# Patient Record
Sex: Male | Born: 1945 | Race: White | Hispanic: No | Marital: Married | Smoking: Never smoker
Health system: Southern US, Community
[De-identification: ages and names within clinical notes are randomized; demographics above are authoritative.]

## PROBLEM LIST (undated history)

## (undated) DIAGNOSIS — N529 Male erectile dysfunction, unspecified: Secondary | ICD-10-CM

## (undated) DIAGNOSIS — R7303 Prediabetes: Secondary | ICD-10-CM

## (undated) DIAGNOSIS — E119 Type 2 diabetes mellitus without complications: Secondary | ICD-10-CM

## (undated) DIAGNOSIS — I10 Essential (primary) hypertension: Secondary | ICD-10-CM

## (undated) DIAGNOSIS — G629 Polyneuropathy, unspecified: Secondary | ICD-10-CM

## (undated) DIAGNOSIS — B181 Chronic viral hepatitis B without delta-agent: Secondary | ICD-10-CM

## (undated) DIAGNOSIS — I480 Paroxysmal atrial fibrillation: Secondary | ICD-10-CM

## (undated) DIAGNOSIS — Z9289 Personal history of other medical treatment: Secondary | ICD-10-CM

## (undated) DIAGNOSIS — E785 Hyperlipidemia, unspecified: Secondary | ICD-10-CM

## (undated) DIAGNOSIS — K760 Fatty (change of) liver, not elsewhere classified: Secondary | ICD-10-CM

## (undated) DIAGNOSIS — C439 Malignant melanoma of skin, unspecified: Secondary | ICD-10-CM

## (undated) DIAGNOSIS — N183 Chronic kidney disease, stage 3 unspecified: Secondary | ICD-10-CM

## (undated) HISTORY — DX: Malignant melanoma of skin, unspecified: C43.9

## (undated) HISTORY — PX: LASIK: SHX215

## (undated) HISTORY — DX: Fatty (change of) liver, not elsewhere classified: K76.0

## (undated) HISTORY — DX: Hyperlipidemia, unspecified: E78.5

## (undated) HISTORY — DX: Polyneuropathy, unspecified: G62.9

## (undated) HISTORY — DX: Type 2 diabetes mellitus without complications: E11.9

## (undated) HISTORY — PX: FOOT SURGERY: SHX648

## (undated) HISTORY — DX: Personal history of other medical treatment: Z92.89

## (undated) HISTORY — PX: OTHER SURGICAL HISTORY: SHX169

## (undated) HISTORY — PX: HYDROCELE EXCISION: SHX482

## (undated) HISTORY — PX: MELANOMA EXCISION: SHX5266

## (undated) HISTORY — DX: Prediabetes: R73.03

## (undated) HISTORY — DX: Essential (primary) hypertension: I10

## (undated) HISTORY — DX: Male erectile dysfunction, unspecified: N52.9

## (undated) HISTORY — DX: Chronic viral hepatitis B without delta-agent: B18.1

---

## 1998-05-20 ENCOUNTER — Other Ambulatory Visit: Admission: RE | Admit: 1998-05-20 | Discharge: 1998-05-20 | Payer: Self-pay | Admitting: Family Medicine

## 2003-04-30 ENCOUNTER — Encounter: Payer: Self-pay | Admitting: General Surgery

## 2003-04-30 ENCOUNTER — Ambulatory Visit (HOSPITAL_COMMUNITY): Admission: RE | Admit: 2003-04-30 | Discharge: 2003-04-30 | Payer: Self-pay | Admitting: General Surgery

## 2003-05-04 ENCOUNTER — Encounter: Payer: Self-pay | Admitting: General Surgery

## 2003-05-04 ENCOUNTER — Ambulatory Visit (HOSPITAL_COMMUNITY): Admission: RE | Admit: 2003-05-04 | Discharge: 2003-05-04 | Payer: Self-pay | Admitting: General Surgery

## 2003-05-04 ENCOUNTER — Encounter (INDEPENDENT_AMBULATORY_CARE_PROVIDER_SITE_OTHER): Payer: Self-pay | Admitting: Specialist

## 2003-08-27 ENCOUNTER — Encounter (HOSPITAL_COMMUNITY): Payer: Self-pay | Admitting: Oncology

## 2003-08-27 ENCOUNTER — Ambulatory Visit (HOSPITAL_COMMUNITY): Admission: RE | Admit: 2003-08-27 | Discharge: 2003-08-27 | Payer: Self-pay | Admitting: Oncology

## 2003-09-06 ENCOUNTER — Ambulatory Visit (HOSPITAL_COMMUNITY): Admission: RE | Admit: 2003-09-06 | Discharge: 2003-09-06 | Payer: Self-pay | Admitting: Oncology

## 2004-01-19 ENCOUNTER — Encounter: Admission: RE | Admit: 2004-01-19 | Discharge: 2004-01-19 | Payer: Self-pay | Admitting: Family Medicine

## 2004-03-13 ENCOUNTER — Ambulatory Visit (HOSPITAL_COMMUNITY): Admission: RE | Admit: 2004-03-13 | Discharge: 2004-03-13 | Payer: Self-pay | Admitting: Oncology

## 2004-03-21 ENCOUNTER — Ambulatory Visit (HOSPITAL_COMMUNITY): Admission: RE | Admit: 2004-03-21 | Discharge: 2004-03-21 | Payer: Self-pay | Admitting: Oncology

## 2004-09-05 ENCOUNTER — Ambulatory Visit (HOSPITAL_COMMUNITY): Admission: RE | Admit: 2004-09-05 | Discharge: 2004-09-05 | Payer: Self-pay | Admitting: Oncology

## 2004-09-08 ENCOUNTER — Ambulatory Visit: Payer: Self-pay | Admitting: Oncology

## 2004-11-13 ENCOUNTER — Encounter: Admission: RE | Admit: 2004-11-13 | Discharge: 2005-02-11 | Payer: Self-pay | Admitting: Family Medicine

## 2005-01-04 ENCOUNTER — Ambulatory Visit: Payer: Self-pay | Admitting: Oncology

## 2005-03-22 ENCOUNTER — Encounter: Admission: RE | Admit: 2005-03-22 | Discharge: 2005-06-20 | Payer: Self-pay | Admitting: Family Medicine

## 2005-05-10 ENCOUNTER — Ambulatory Visit: Payer: Self-pay | Admitting: Oncology

## 2005-05-12 ENCOUNTER — Emergency Department (HOSPITAL_COMMUNITY): Admission: EM | Admit: 2005-05-12 | Discharge: 2005-05-12 | Payer: Self-pay | Admitting: Emergency Medicine

## 2005-10-31 ENCOUNTER — Ambulatory Visit: Payer: Self-pay | Admitting: Oncology

## 2005-11-01 ENCOUNTER — Ambulatory Visit (HOSPITAL_COMMUNITY): Admission: RE | Admit: 2005-11-01 | Discharge: 2005-11-01 | Payer: Self-pay | Admitting: Oncology

## 2006-04-29 ENCOUNTER — Ambulatory Visit: Payer: Self-pay | Admitting: Oncology

## 2006-04-30 ENCOUNTER — Encounter: Admission: RE | Admit: 2006-04-30 | Discharge: 2006-04-30 | Payer: Self-pay | Admitting: Family Medicine

## 2006-05-02 LAB — COMPREHENSIVE METABOLIC PANEL
ALT: 33 U/L (ref 0–40)
AST: 26 U/L (ref 0–37)
Albumin: 5.1 g/dL (ref 3.5–5.2)
Calcium: 9.8 mg/dL (ref 8.4–10.5)
Chloride: 102 mEq/L (ref 96–112)
Potassium: 4.6 mEq/L (ref 3.5–5.3)
Sodium: 138 mEq/L (ref 135–145)

## 2006-05-02 LAB — CBC WITH DIFFERENTIAL/PLATELET
BASO%: 0.3 % (ref 0.0–2.0)
Basophils Absolute: 0 10*3/uL (ref 0.0–0.1)
EOS%: 3.8 % (ref 0.0–7.0)
HGB: 14.8 g/dL (ref 13.0–17.1)
MCH: 32.7 pg (ref 28.0–33.4)
MCHC: 35.5 g/dL (ref 32.0–35.9)
RDW: 13.1 % (ref 11.2–14.6)
lymph#: 1.4 10*3/uL (ref 0.9–3.3)

## 2006-05-03 ENCOUNTER — Ambulatory Visit (HOSPITAL_COMMUNITY): Admission: RE | Admit: 2006-05-03 | Discharge: 2006-05-03 | Payer: Self-pay | Admitting: Oncology

## 2006-05-09 ENCOUNTER — Encounter: Admission: RE | Admit: 2006-05-09 | Discharge: 2006-05-09 | Payer: Self-pay | Admitting: Family Medicine

## 2006-07-06 ENCOUNTER — Emergency Department (HOSPITAL_COMMUNITY): Admission: EM | Admit: 2006-07-06 | Discharge: 2006-07-06 | Payer: Self-pay | Admitting: Emergency Medicine

## 2006-07-10 ENCOUNTER — Ambulatory Visit (HOSPITAL_BASED_OUTPATIENT_CLINIC_OR_DEPARTMENT_OTHER): Admission: RE | Admit: 2006-07-10 | Discharge: 2006-07-10 | Payer: Self-pay | Admitting: Orthopedic Surgery

## 2007-05-06 ENCOUNTER — Ambulatory Visit: Payer: Self-pay | Admitting: Oncology

## 2007-05-08 LAB — LACTATE DEHYDROGENASE: LDH: 139 U/L (ref 94–250)

## 2007-05-08 LAB — CBC WITH DIFFERENTIAL/PLATELET
Basophils Absolute: 0 10*3/uL (ref 0.0–0.1)
EOS%: 2.9 % (ref 0.0–7.0)
LYMPH%: 20.3 % (ref 14.0–48.0)
MCH: 32 pg (ref 28.0–33.4)
MCV: 91.3 fL (ref 81.6–98.0)
MONO%: 7.2 % (ref 0.0–13.0)
RBC: 4.75 10*6/uL (ref 4.20–5.71)
RDW: 14.2 % (ref 11.2–14.6)

## 2007-05-08 LAB — COMPREHENSIVE METABOLIC PANEL
ALT: 28 U/L (ref 0–53)
Alkaline Phosphatase: 99 U/L (ref 39–117)
CO2: 29 mEq/L (ref 19–32)
Potassium: 4.8 mEq/L (ref 3.5–5.3)
Sodium: 138 mEq/L (ref 135–145)
Total Bilirubin: 1 mg/dL (ref 0.3–1.2)
Total Protein: 6.4 g/dL (ref 6.0–8.3)

## 2007-07-29 ENCOUNTER — Ambulatory Visit (HOSPITAL_COMMUNITY): Admission: RE | Admit: 2007-07-29 | Discharge: 2007-07-29 | Payer: Self-pay | Admitting: Oncology

## 2007-11-20 ENCOUNTER — Ambulatory Visit: Payer: Self-pay | Admitting: Oncology

## 2007-11-20 LAB — CBC WITH DIFFERENTIAL/PLATELET
BASO%: 0.4 % (ref 0.0–2.0)
EOS%: 3.1 % (ref 0.0–7.0)
HCT: 40.3 % (ref 38.7–49.9)
LYMPH%: 30.3 % (ref 14.0–48.0)
MCH: 32.7 pg (ref 28.0–33.4)
MCHC: 35.7 g/dL (ref 32.0–35.9)
NEUT%: 59 % (ref 40.0–75.0)
Platelets: 138 10*3/uL — ABNORMAL LOW (ref 145–400)

## 2007-11-20 LAB — COMPREHENSIVE METABOLIC PANEL
ALT: 26 U/L (ref 0–53)
AST: 21 U/L (ref 0–37)
Creatinine, Ser: 1.46 mg/dL (ref 0.40–1.50)
Total Bilirubin: 1.1 mg/dL (ref 0.3–1.2)

## 2008-05-17 ENCOUNTER — Ambulatory Visit: Payer: Self-pay | Admitting: Oncology

## 2008-05-20 LAB — CBC WITH DIFFERENTIAL/PLATELET
BASO%: 0.4 % (ref 0.0–2.0)
Eosinophils Absolute: 0.2 10*3/uL (ref 0.0–0.5)
MCHC: 35.5 g/dL (ref 32.0–35.9)
MONO#: 0.4 10*3/uL (ref 0.1–0.9)
MONO%: 8 % (ref 0.0–13.0)
NEUT#: 2.8 10*3/uL (ref 1.5–6.5)
RBC: 4.59 10*6/uL (ref 4.20–5.71)
RDW: 12.9 % (ref 11.2–14.6)
WBC: 4.8 10*3/uL (ref 4.0–10.0)

## 2008-05-20 LAB — COMPREHENSIVE METABOLIC PANEL
ALT: 40 U/L (ref 0–53)
Albumin: 4.7 g/dL (ref 3.5–5.2)
Alkaline Phosphatase: 95 U/L (ref 39–117)
CO2: 26 mEq/L (ref 19–32)
Glucose, Bld: 119 mg/dL — ABNORMAL HIGH (ref 70–99)
Potassium: 5 mEq/L (ref 3.5–5.3)
Sodium: 143 mEq/L (ref 135–145)
Total Protein: 7 g/dL (ref 6.0–8.3)

## 2008-05-20 LAB — LACTATE DEHYDROGENASE: LDH: 149 U/L (ref 94–250)

## 2008-09-28 ENCOUNTER — Ambulatory Visit (HOSPITAL_COMMUNITY): Admission: RE | Admit: 2008-09-28 | Discharge: 2008-09-28 | Payer: Self-pay | Admitting: Oncology

## 2009-05-17 ENCOUNTER — Ambulatory Visit: Payer: Self-pay | Admitting: Oncology

## 2010-05-19 ENCOUNTER — Ambulatory Visit: Payer: Self-pay | Admitting: Oncology

## 2010-05-23 ENCOUNTER — Ambulatory Visit (HOSPITAL_COMMUNITY): Admission: RE | Admit: 2010-05-23 | Discharge: 2010-05-23 | Payer: Self-pay | Admitting: Oncology

## 2010-05-23 LAB — CBC WITH DIFFERENTIAL/PLATELET
BASO%: 0.3 % (ref 0.0–2.0)
EOS%: 2 % (ref 0.0–7.0)
HCT: 38 % — ABNORMAL LOW (ref 38.4–49.9)
LYMPH%: 19.2 % (ref 14.0–49.0)
MCH: 31.7 pg (ref 27.2–33.4)
MCHC: 35.1 g/dL (ref 32.0–36.0)
MONO#: 0.4 10*3/uL (ref 0.1–0.9)
NEUT%: 73.3 % (ref 39.0–75.0)
Platelets: 157 10*3/uL (ref 140–400)

## 2010-05-23 LAB — COMPREHENSIVE METABOLIC PANEL
ALT: 17 U/L (ref 0–53)
CO2: 22 mEq/L (ref 19–32)
Creatinine, Ser: 1.25 mg/dL (ref 0.40–1.50)
Total Bilirubin: 0.7 mg/dL (ref 0.3–1.2)

## 2010-05-23 LAB — LACTATE DEHYDROGENASE: LDH: 135 U/L (ref 94–250)

## 2010-11-26 ENCOUNTER — Encounter (HOSPITAL_COMMUNITY): Payer: Self-pay | Admitting: Oncology

## 2011-03-23 NOTE — Op Note (Signed)
NAMEEMMONS, TOTH               ACCOUNT NO.:  [id number]   MEDICAL RECORD NO.:  [id number]          PATIENT TYPE:  AMB   LOCATION:  DSC                          FACILITY:  [facility]   PHYSICIAN:  Mila Homer. Sherlean Foot, M.D. DATE OF BIRTH:  06-07-1946   DATE OF PROCEDURE:  07/10/2006  DATE OF DISCHARGE:                                 OPERATIVE REPORT   SURGEON:  Mila Homer. Sherlean Foot, M.D.   ASSISTANT:  None.   ANESTHESIA:  General.   PREOPERATIVE DIAGNOSIS:  Right ankle bimalleolar ankle fracture.   POSTOPERATIVE DIAGNOSIS:  Same.   PROCEDURE:  Right ankle open reduction and internal fixation.   INDICATIONS FOR PROCEDURE:  The patient is a 65 year old white male with an  accident over the weekend that took him to the emergency room and he was  referred to me.  Informed consent was obtained for ORIF of the right ankle.   DESCRIPTION OF PROCEDURE:  The patient was placed supine and administered  general anesthesia.  The right leg was prepped and draped in the usual  sterile fashion.  A exsanguinated the extremity with the esmarch and then  inflated the tourniquet to 300 mm of Hz.  I then marked out a lateral  incision over the fracture site and it was approximately 6 cm in length. I  sharply incised the skin, bluntly incised the soft tissues to be careful of  the superficial peroneal nerve.  Once I got down to bone I cleaned off the  periosteum with periosteal elevator.  I then lavaged out the fracture site  and then used reduction clamps to reduce the fracture anatomically.  I then  placed a 6-hole semi-tubular plate and fastened it to the lateral cortex  with the fracture reduced using bicortical screws.  I then verified under AP  and lateral C-arm imaging that this was anatomic.  I then irrigated and  closed with 0 anterior Vicryl sutures and small skin staples.  I then turned  my attention to the medial side of the ankle.  I used a percutaneous  technique with the 4.5 mm cannulated  screw set.  I placed the guide wire in  under direct visualization with the AP and lateral C-arm images.  I then  over drilled with the 3.2 drill bit and then placed two partially threaded  45 mm screws to reduce the fracture anatomically.  The mortis was absolutely  anatomic.  I checked it on AP and lateral C-arm images.  I removed the pins  and then closed with two staples in each poke hole.  I then dressed it with  adaptive 4 x 4x, Webril and then a posterior splint.  Tourniquet time was 30  minutes.  EBL minimal.           ______________________________  Mila Homer. Sherlean Foot, M.D.     SDL/MEDQ  D:  07/10/2006  T:  07/10/2006  Job:  [id number]

## 2011-03-23 NOTE — Op Note (Signed)
Andrew Church, TUPPER NO.:  [id number]   MEDICAL RECORD NO.:  [id number]                   PATIENT TYPE:  OIB   LOCATION:  NA                                   FACILITY:  [facility]   PHYSICIAN:  Gita Kudo, M.D.              DATE OF BIRTH:  [birth year]   DATE OF PROCEDURE:  05/04/2003  DATE OF DISCHARGE:                                 OPERATIVE REPORT   OPERATIVE PROCEDURE:  Wide excision melanoma right groin with en bloc  resection sentinel lymph node.   SURGEON:  Gita Kudo, M.D.   ANESTHESIA:  General.   PREOPERATIVE DIAGNOSIS:  Malignant melanoma right groin.   POSTOPERATIVE DIAGNOSIS:  Malignant melanoma right groin.   CLINICAL SUMMARY:  65 year old male with a lesion in his right groin of  unknown duration had punch biopsy showing a level 4, 1.23 mm thick melanoma.  He comes in for excision.  He has had a preop scintigram showing activity in  the right groin.   OPERATIVE FINDINGS:  The patient had a hot blue node in the right groin.  After it was removed, the groin had just background, light radioactive  counts.  The lesion was removed with a 2 to 3 cm margin in all directions as  measured up.   OPERATIVE PROCEDURE:  Under satisfactory general anesthesia, the patient was  placed in a frog leg position and prepped and draped in a standard fashion.  Neoprobe was used to scan the groin and a hot area was noted about 2 inches  above the superior border of the melanoma.  It was marked.  Then, a ruler  was used to measure out 2.5 cm in all directions and an elliptical incision  made encompassing this wide margin of normal skin around the melanoma.  The  groin tissue was fairly lax and I carried the dissection down through the  fat to the superficial fascia.  The saphenous nerve was identified and not  injured.  Because the blue node was so close to the lesion, itself, I  removed it en bloc with the remaining skin and subcu from the  melanoma  biopsy.  When completed, the area was marked with sutures and sent to  pathology.  I discussed the situation with the pathologist so they would  know what we were searching for.  The wound was then lavaged with saline and  closed in layers with deep 2-0 Vicryl suture and staples for the skin.  Sterile absorbant dressings were then applied and the patient went to the  recovery room from the operating room in good condition without  complications.  Gita Kudo, M.D.    MRL/MEDQ  D:  05/04/2003  T:  05/04/2003  Job:  [id number]   cc:   Bethann Berkshire, M.D.

## 2011-06-07 ENCOUNTER — Other Ambulatory Visit (HOSPITAL_COMMUNITY): Payer: Self-pay | Admitting: Oncology

## 2011-06-07 ENCOUNTER — Ambulatory Visit (HOSPITAL_COMMUNITY)
Admission: RE | Admit: 2011-06-07 | Discharge: 2011-06-07 | Disposition: A | Discharge status: Home / Self Care | Payer: BC Managed Care – PPO | Source: Ambulatory Visit | Attending: Oncology | Admitting: Oncology

## 2011-06-07 ENCOUNTER — Encounter (HOSPITAL_BASED_OUTPATIENT_CLINIC_OR_DEPARTMENT_OTHER): Payer: BC Managed Care – PPO | Admitting: Oncology

## 2011-06-07 DIAGNOSIS — Z79899 Other long term (current) drug therapy: Secondary | ICD-10-CM | POA: Insufficient documentation

## 2011-06-07 DIAGNOSIS — C4359 Malignant melanoma of other part of trunk: Secondary | ICD-10-CM

## 2011-06-07 DIAGNOSIS — E119 Type 2 diabetes mellitus without complications: Secondary | ICD-10-CM | POA: Insufficient documentation

## 2011-06-07 DIAGNOSIS — C439 Malignant melanoma of skin, unspecified: Secondary | ICD-10-CM

## 2011-06-07 DIAGNOSIS — I10 Essential (primary) hypertension: Secondary | ICD-10-CM | POA: Insufficient documentation

## 2011-06-07 DIAGNOSIS — Z85820 Personal history of malignant melanoma of skin: Secondary | ICD-10-CM | POA: Insufficient documentation

## 2011-06-07 LAB — CBC WITH DIFFERENTIAL/PLATELET
Basophils Absolute: 0 10*3/uL (ref 0.0–0.1)
EOS%: 2.4 % (ref 0.0–7.0)
Eosinophils Absolute: 0.1 10*3/uL (ref 0.0–0.5)
HCT: 36.1 % — ABNORMAL LOW (ref 38.4–49.9)
HGB: 12.6 g/dL — ABNORMAL LOW (ref 13.0–17.1)
MCH: 31.5 pg (ref 27.2–33.4)
MONO#: 0.4 10*3/uL (ref 0.1–0.9)
NEUT#: 4 10*3/uL (ref 1.5–6.5)
NEUT%: 70.2 % (ref 39.0–75.0)
lymph#: 1.1 10*3/uL (ref 0.9–3.3)

## 2011-06-07 LAB — COMPREHENSIVE METABOLIC PANEL
BUN: 20 mg/dL (ref 6–23)
CO2: 27 mEq/L (ref 19–32)
Calcium: 10.3 mg/dL (ref 8.4–10.5)
Chloride: 104 mEq/L (ref 96–112)
Creatinine, Ser: 1.2 mg/dL (ref 0.50–1.35)
Glucose, Bld: 107 mg/dL — ABNORMAL HIGH (ref 70–99)

## 2011-06-07 LAB — LACTATE DEHYDROGENASE: LDH: 164 U/L (ref 94–250)

## 2011-06-11 ENCOUNTER — Other Ambulatory Visit (HOSPITAL_COMMUNITY): Payer: Self-pay | Admitting: Oncology

## 2011-06-11 LAB — FECAL OCCULT BLOOD, GUAIAC: Occult Blood: NEGATIVE

## 2011-12-14 ENCOUNTER — Telehealth: Payer: Self-pay | Admitting: Oncology

## 2011-12-14 NOTE — Telephone Encounter (Signed)
gve the pt her aug 2013 appts °

## 2011-12-14 NOTE — Telephone Encounter (Signed)
S/w the pt and he is aware of his aug 2013 appts

## 2012-03-28 ENCOUNTER — Telehealth: Payer: Self-pay | Admitting: Oncology

## 2012-03-28 NOTE — Telephone Encounter (Signed)
s/w pts wife and she will give him the appt for 8/1 at 9:00

## 2012-05-27 ENCOUNTER — Telehealth: Payer: Self-pay | Admitting: Oncology

## 2012-05-27 NOTE — Telephone Encounter (Signed)
Moved 8/1 appt from [facility] to [facility]. lmonvm for pt on both home/cell re new time for 8/1 @ 1 pm.

## 2012-06-05 ENCOUNTER — Ambulatory Visit (HOSPITAL_BASED_OUTPATIENT_CLINIC_OR_DEPARTMENT_OTHER): Payer: BC Managed Care – PPO | Admitting: Family

## 2012-06-05 ENCOUNTER — Ambulatory Visit (HOSPITAL_COMMUNITY)
Admission: RE | Admit: 2012-06-05 | Discharge: 2012-06-05 | Disposition: A | Discharge status: Home / Self Care | Payer: BC Managed Care – PPO | Source: Ambulatory Visit | Attending: Family | Admitting: Family

## 2012-06-05 ENCOUNTER — Other Ambulatory Visit (HOSPITAL_BASED_OUTPATIENT_CLINIC_OR_DEPARTMENT_OTHER): Payer: BC Managed Care – PPO

## 2012-06-05 ENCOUNTER — Ambulatory Visit: Payer: BC Managed Care – PPO | Admitting: Physician Assistant

## 2012-06-05 ENCOUNTER — Encounter: Payer: BC Managed Care – PPO | Admitting: Physician Assistant

## 2012-06-05 ENCOUNTER — Encounter: Payer: Self-pay | Admitting: Family

## 2012-06-05 ENCOUNTER — Other Ambulatory Visit: Payer: BC Managed Care – PPO | Admitting: Lab

## 2012-06-05 VITALS — BP 139/83 | HR 66 | Temp 97.3°F | Ht 71.0 in | Wt 181.5 lb

## 2012-06-05 DIAGNOSIS — K5900 Constipation, unspecified: Secondary | ICD-10-CM

## 2012-06-05 DIAGNOSIS — C439 Malignant melanoma of skin, unspecified: Secondary | ICD-10-CM

## 2012-06-05 DIAGNOSIS — Z85820 Personal history of malignant melanoma of skin: Secondary | ICD-10-CM | POA: Insufficient documentation

## 2012-06-05 DIAGNOSIS — Z09 Encounter for follow-up examination after completed treatment for conditions other than malignant neoplasm: Secondary | ICD-10-CM | POA: Insufficient documentation

## 2012-06-05 DIAGNOSIS — C4359 Malignant melanoma of other part of trunk: Secondary | ICD-10-CM

## 2012-06-05 DIAGNOSIS — I10 Essential (primary) hypertension: Secondary | ICD-10-CM | POA: Insufficient documentation

## 2012-06-05 DIAGNOSIS — G589 Mononeuropathy, unspecified: Secondary | ICD-10-CM

## 2012-06-05 LAB — COMPREHENSIVE METABOLIC PANEL
BUN: 24 mg/dL — ABNORMAL HIGH (ref 6–23)
CO2: 25 mEq/L (ref 19–32)
Calcium: 9.6 mg/dL (ref 8.4–10.5)
Chloride: 106 mEq/L (ref 96–112)
Creatinine, Ser: 1.3 mg/dL (ref 0.50–1.35)
Glucose, Bld: 90 mg/dL (ref 70–99)

## 2012-06-05 LAB — CBC WITH DIFFERENTIAL/PLATELET
Basophils Absolute: 0 10*3/uL (ref 0.0–0.1)
EOS%: 2.9 % (ref 0.0–7.0)
HCT: 39.9 % (ref 38.4–49.9)
HGB: 13.6 g/dL (ref 13.0–17.1)
MCH: 31.4 pg (ref 27.2–33.4)
MCV: 92.1 fL (ref 79.3–98.0)
MONO%: 6.9 % (ref 0.0–14.0)
NEUT%: 64.7 % (ref 39.0–75.0)
lymph#: 1.2 10*3/uL (ref 0.9–3.3)

## 2012-06-05 LAB — LACTATE DEHYDROGENASE: LDH: 125 U/L (ref 94–250)

## 2012-06-05 NOTE — Patient Instructions (Addendum)
Patient ID: Andrew Church,   DOB: 01/25/1946,  MRN: [id number]   [facility] Discharge Instructions  RECOMMENDATIONS MAD BY THE CONSULTANT AND ANY TEST RESULT(S) WILL BE FORWARDED TO YOU REFERRING DOCTOR   EXAM FINDINGS BY NURSE PRACTITIONER TODAY TO REPORT TO THE CLINIC OR PRIMARY PROVIDER: N/A   Your Current Medications Are: No current outpatient prescriptions on file.     INSTRUCTIONS GIVEN, DISCUSSED AND FOLLOW-UP: Stool softener, prunes, Miralax, increase water intake and increase fiber intake for constipation.  I acknowledge that I have been informed and understand all the instructions given to me and have received a copy.  I do not have any further questions at this time, but I understand that I may call the [facility] at [phone] during business hours should I have any further questions or need assistance in obtaining follow-up care.   06/05/2012, 2:11 PM

## 2012-06-05 NOTE — Progress Notes (Addendum)
Patient ID: Andrew Church, male   DOB: 1945/11/21, 66 y.o.   MRN: [id number] CSN: [id number]  CC: Andrew Church. Andrew Gunning, MD  Problem List: Andrew Church is a 66 y.o. Caucasian male with a problem list consisting of: 1. 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 Andrew Church was last seen by [place] on 06/07/2011.  Andrew Church has had sun exposure in the past and had shown Dr. Arline Asp a couple of lesions on the left side of his face and the other on his right pinna. Dr. Arline Asp suggested at that time that the patient be seen by a Dermatologist.  2. Neuropathy 3. Prediabetes 4. Hyperlipidemia 5. Hypertension  Past Medical History: Past Medical History  Diagnosis Date  . Neuropathy   . Prediabetes   . HTN (hypertension)   . Hyperlipemia   . Melanoma     Surgical History: Past Surgical History  Procedure Date  . Foot surgery     Current Medications: Current Outpatient Prescriptions  Medication Sig Dispense Refill  . aspirin 81 MG tablet Take 81 mg by mouth daily.      . Biotin 1000 MCG tablet Take 1,000 mcg by mouth daily.      . Cinnamon 500 MG capsule Take 500 mg by mouth daily.      Marland Kitchen gabapentin (NEURONTIN) 800 MG tablet Take 800 mg by mouth 3 (three) times daily.      Marland Kitchen gemfibrozil (LOPID) 600 MG tablet Take 600 mg by mouth 2 (two) times daily.      . Ginkgo Biloba 40 MG TABS Take 120 mg by mouth daily.      . Glucosamine-Chondroit-Vit C-Mn (GLUCOSAMINE CHONDR 1500 COMPLX) CAPS Take 1 capsule by mouth 2 (two) times daily.      Marland Kitchen L-Methylfolate-B6-B12 (METANX PO) Take 180 mg by mouth 2 (two) times daily.      Marland Kitchen lisinopril (PRINIVIL,ZESTRIL) 20 MG tablet Take 20 mg by mouth daily.      . metFORMIN (GLUCOPHAGE) 500 MG tablet Take 500 mg by mouth 2 (two) times daily with a meal.      . POTASSIUM GLUCONATE PO Take 1,200 mg by mouth 2 (two) times daily.      . Red Yeast Rice 600 MG CAPS Take 600 mg by mouth  daily.      Marland Kitchen topiramate (TOPAMAX) 25 MG tablet Take 25 mg by mouth daily.      . traMADol (ULTRAM) 50 MG tablet Take 50-100 mg by mouth every 8 (eight) hours as needed.        Allergies: Allergies  Allergen Reactions  . Penicillins Rash     Family History: Family History  Problem Relation Age of Onset  . Heart attack Mother   . Emphysema Father     Social History: History  Substance Use Topics  . Smoking status: Never Smoker   . Smokeless tobacco: Not on file  . Alcohol Use: No    Review of Systems: 10 Point review of systems was negative except as noted above.   Physical Exam:   Blood pressure 139/83, pulse 66, temperature 97.3 F (36.3 C), temperature source Oral, height 5\' 11"  (1.803 m), weight 181 lb 8 oz (82.328 kg).  General Appearance: Alert, cooperative, well nourished, well developed, no apparent distress Head: Normocephalic, without obvious abnormality, atraumatic Eyes: Conjunctivae/corneas clear, PERRLA, EOMI, anti-icteric sclera Nose: Nares, septum and mucosa are normal, no drainage or sinus tenderness Neck: No  adenopathy, supple, symmetrical, trachea midline, thyroid not enlarged, no tenderness Resp: Clear to auscultation bilaterally Cardio: Regular rate and rhythm, S1, S2 normal, no murmur/click/rub/gallop GI: Soft, non-tender; bowel sounds normal; no masses,  no organomegaly GU: Deferred, the patient denies any inguinal masses or tenderness Extremities: Extremities normal, atraumatic, no cyanosis or edema Lymph nodes: Cervical, supraclavicular, and axillary nodes normal.   Laboratory Data: Results for orders placed in visit on 06/07/11 (from the past 48 hour(s))  CBC WITH DIFFERENTIAL     Status: Abnormal   Collection Time   06/05/12  1:09 PM      Component Value Range Comment   WBC 4.8  4.0 - 10.3 10e3/uL    NEUT# 3.1  1.5 - 6.5 10e3/uL    HGB 13.6  13.0 - 17.1 g/dL    HCT 16.1  09.6 - 04.5 %    Platelets 120 (*) 140 - 400 10e3/uL    MCV 92.1   79.3 - 98.0 fL    MCH 31.4  27.2 - 33.4 pg    MCHC 34.1  32.0 - 36.0 g/dL    RBC 4.09  8.11 - 9.14 10e6/uL    RDW 14.0  11.0 - 14.6 %    lymph# 1.2  0.9 - 3.3 10e3/uL    MONO# 0.3  0.1 - 0.9 10e3/uL    Eosinophils Absolute 0.1  0.0 - 0.5 10e3/uL    Basophils Absolute 0.0  0.0 - 0.1 10e3/uL    NEUT% 64.7  39.0 - 75.0 %    LYMPH% 25.0  14.0 - 49.0 %    MONO% 6.9  0.0 - 14.0 %    EOS% 2.9  0.0 - 7.0 %    BASO% 0.5  0.0 - 2.0 %     Imaging Studies: Dg Chest 2 View  06/05/2012  *RADIOLOGY REPORT*  Clinical Data: Annual follow up, hypertension, history of melanoma  CHEST - 2 VIEW  Comparison: 06/07/2011  Findings: Normal heart size and vascularity.  Negative for acute airspace process, edema, pneumonia, collapse, consolidation, effusion, or pneumothorax.  Trachea midline.  Degenerative changes of the spine.  IMPRESSION: Stable chest exam.  No acute process  Original Report Authenticated By: Judie Petit. Andrew Church, M.D.     Impression/Plan: Andrew Church is now 66 years out from his diagnosis. The patient is complaining of constipation today. The patient has been instructed to increase his fluid intake, consume prunes/Miralax, increase his fiber intake, and take daily stool softener. The patient states that he he has had a colonoscopy this year, he receives the influenza vaccine yearly and he state he had the pneumonia vaccine 2 months ago.  The patient did not complaining of any memory decline or melena during this office visit. The patient was without complaints or symptamatology other than constipation during this office visit. The patient will return in one year for a followup appointment and laboratories, but is asked to return to the office immediately if he notices any changes.  Plan of care reviewed with Dr. Arline Asp.   Andrew Hasler NP-C 06/05/2012, 2:46 PM

## 2013-06-08 ENCOUNTER — Other Ambulatory Visit: Payer: Self-pay | Admitting: Medical Oncology

## 2013-06-08 DIAGNOSIS — C439 Malignant melanoma of skin, unspecified: Secondary | ICD-10-CM

## 2013-06-09 ENCOUNTER — Other Ambulatory Visit: Payer: BC Managed Care – PPO | Admitting: Lab

## 2013-06-09 ENCOUNTER — Ambulatory Visit: Payer: BC Managed Care – PPO

## 2013-06-09 ENCOUNTER — Telehealth: Payer: Self-pay | Admitting: Hematology and Oncology

## 2013-06-09 ENCOUNTER — Ambulatory Visit: Payer: BC Managed Care – PPO | Admitting: Physician Assistant

## 2013-06-09 NOTE — Telephone Encounter (Signed)
per Sallye Ober and Dr. Karel Jarvis they wanted to put this pt on [facility] sched Done

## 2013-09-03 ENCOUNTER — Emergency Department (HOSPITAL_COMMUNITY): Payer: BC Managed Care – PPO

## 2013-09-03 ENCOUNTER — Emergency Department (HOSPITAL_COMMUNITY)
Admission: EM | Admit: 2013-09-03 | Discharge: 2013-09-03 | Disposition: A | Discharge status: Home / Self Care | Payer: BC Managed Care – PPO | Attending: Emergency Medicine | Admitting: Emergency Medicine

## 2013-09-03 ENCOUNTER — Encounter (HOSPITAL_COMMUNITY): Payer: Self-pay | Admitting: Emergency Medicine

## 2013-09-03 DIAGNOSIS — I10 Essential (primary) hypertension: Secondary | ICD-10-CM | POA: Insufficient documentation

## 2013-09-03 DIAGNOSIS — G589 Mononeuropathy, unspecified: Secondary | ICD-10-CM | POA: Insufficient documentation

## 2013-09-03 DIAGNOSIS — Z8639 Personal history of other endocrine, nutritional and metabolic disease: Secondary | ICD-10-CM | POA: Insufficient documentation

## 2013-09-03 DIAGNOSIS — Z85820 Personal history of malignant melanoma of skin: Secondary | ICD-10-CM | POA: Insufficient documentation

## 2013-09-03 DIAGNOSIS — R079 Chest pain, unspecified: Secondary | ICD-10-CM

## 2013-09-03 DIAGNOSIS — Z862 Personal history of diseases of the blood and blood-forming organs and certain disorders involving the immune mechanism: Secondary | ICD-10-CM | POA: Insufficient documentation

## 2013-09-03 DIAGNOSIS — Z7982 Long term (current) use of aspirin: Secondary | ICD-10-CM | POA: Insufficient documentation

## 2013-09-03 DIAGNOSIS — R0789 Other chest pain: Secondary | ICD-10-CM | POA: Insufficient documentation

## 2013-09-03 DIAGNOSIS — Z880 Allergy status to penicillin: Secondary | ICD-10-CM | POA: Insufficient documentation

## 2013-09-03 DIAGNOSIS — Z79899 Other long term (current) drug therapy: Secondary | ICD-10-CM | POA: Insufficient documentation

## 2013-09-03 LAB — COMPREHENSIVE METABOLIC PANEL
ALT: 33 U/L (ref 0–53)
AST: 27 U/L (ref 0–37)
CO2: 28 mEq/L (ref 19–32)
Calcium: 9.7 mg/dL (ref 8.4–10.5)
Creatinine, Ser: 1.27 mg/dL (ref 0.50–1.35)
GFR calc Af Amer: 65 mL/min — ABNORMAL LOW (ref >90)
GFR calc non Af Amer: 56 mL/min — ABNORMAL LOW (ref >90)
Sodium: 140 mEq/L (ref 135–145)
Total Protein: 7 g/dL (ref 6.0–8.3)

## 2013-09-03 LAB — CBC
MCH: 31.9 pg (ref 26.0–34.0)
MCHC: 35.8 g/dL (ref 30.0–36.0)
MCV: 89.2 fL (ref 78.0–100.0)
Platelets: 125 10*3/uL — ABNORMAL LOW (ref 150–400)
RDW: 13.4 % (ref 11.5–15.5)

## 2013-09-03 LAB — APTT: aPTT: 31 seconds (ref 24–37)

## 2013-09-03 LAB — POCT I-STAT TROPONIN I: Troponin i, poc: 0 ng/mL (ref 0.00–0.08)

## 2013-09-03 LAB — PROTIME-INR: INR: 1.04 (ref 0.00–1.49)

## 2013-09-03 MED ORDER — SODIUM CHLORIDE 0.9 % IV SOLN
1000.0000 mL | INTRAVENOUS | Status: DC
  Administered 2013-09-03: 1000 mL via INTRAVENOUS

## 2013-09-03 MED ORDER — ASPIRIN 81 MG PO CHEW: 324.0000 mg | CHEWABLE_TABLET | Freq: Once | ORAL | Status: DC

## 2013-09-03 NOTE — ED Provider Notes (Signed)
.   Medical screening examination/treatment/procedure(s) were performed by non-physician practitioner and as supervising physician I was immediately available for consultation/collaboration.  EKG Interpretation     Ventricular Rate:  63 PR Interval:  173 QRS Duration: 98 QT Interval:  405 QTC Calculation: 415 R Axis:   70 Text Interpretation:  Sinus rhythm Abnormal R-wave progression, early transition No significant change since last tracing             Harrold Donath R. Rubin Payor, MD 09/03/13 2326

## 2013-09-03 NOTE — ED Notes (Signed)
Pt to ed via ems for eval of cp.  Had been shopping and to the gym and walked up to EMS base with c/o intermittent CP.  Pain seems to be linked to PVCs, which were every third beat.  Initial BP 200/100. 324 asa and 1 sl ntg.  PVCs less frequent.  No c/o cp at this time.

## 2013-09-03 NOTE — ED Provider Notes (Signed)
Full history, ROS, physical examination may be reviewed from Dr. Shela Commons Knapp's chart.  Physical Exam  BP 171/86  Pulse 61  Temp(Src) 98.6 F (37 C) (Oral)  Resp 12  Wt 182 lb (82.555 kg)  BMI 25.4 kg/m2  SpO2 98%  Physical Exam  ED Course  Procedures      POCT i-Stat troponin I (Final result)   Component (Lab Inquiry)      Collection Time Result Time Troponin i, poc Comment 3    09/03/13 22:23:00 09/03/13 22:36:00 0.00        Due to the release kinetics of cTnI, a negative result within the first hours of the onset of symptoms does not rule out myocardial infarction with certainty. If myocardial infarction is still suspected, repeat the test at appropriate intervals.                   POCT i-Stat troponin I (Final result)   Component (Lab Inquiry)      Collection Time Result Time Troponin i, poc Comment 3    09/03/13 19:22:00 09/03/13 19:31:12 0.00        Due to the release kinetics of cTnI, a negative result within the first hours of the onset of symptoms does not rule out myocardial infarction with certainty. If myocardial infarction is still suspected, repeat the test at appropriate intervals.                   CBC (Final result) Abnormal Component (Lab Inquiry)      Collection Time Result Time WBC RBC HGB HCT MCV    09/03/13 17:44:00 09/03/13 19:23:44 5.6 4.17 (L) 13.3 37.2 (L) 89.2         Collection Time Result Time MCH MCHC RDW PLT    09/03/13 17:44:00 09/03/13 19:23:44 31.9 35.8 13.4 125 (L)                   Comprehensive metabolic panel (Final result) Abnormal Component (Lab Inquiry)      Collection Time Result Time NA K CL CO2 GLUCOSE    09/03/13 17:44:00 09/03/13 19:55:10 140 4.5 104 28 97         Collection Time Result Time BUN Creatinine, Ser CALCIUM PROTEIN Albumin    09/03/13 17:44:00 09/03/13 19:55:10 21 1.27 9.7 7.0 4.2         Collection Time Result Time AST ALT ALK PHOS BILI TOTL GFR calc non Af Amer    09/03/13 17:44:00 09/03/13 19:55:10 27 33 99 0.7  56 (L)         Collection Time Result Time GFR calc Af Amer    09/03/13 17:44:00 09/03/13 19:55:10 65 (L) (NOTE) The eGFR has been calculated using the CKD EPI equation. This calculation has not been validated in all clinical situations. eGFR's persistently <90 mL/min signify possible Chronic Kidney Disease.                   Protime-INR (Final result)   Component (Lab Inquiry)      Collection Time Result Time Prothrombin Time INR    09/03/13 17:44:00 09/03/13 19:31:07 13.4 1.04                   APTT (Final result)   Component (Lab Inquiry)      Collection Time Result Time aPTT    09/03/13 17:44:00 09/03/13 19:31:07 31                Imaging Results  DG Chest Portable 1 View (Final result)   Result time: 09/03/13 18:50:47    Final result by Rad Results In Interface (09/03/13 18:50:47)    Narrative:    CLINICAL DATA:  Chest pain  EXAM: PORTABLE CHEST - 1 VIEW  COMPARISON:  06/05/2012  FINDINGS: Cardiomediastinal silhouette is stable. No acute infiltrate or pleural effusion. No pulmonary edema. Bony thorax is unremarkable.  IMPRESSION: No active disease.   Electronically Signed   By: Natasha Mead M.D.   On: 09/03/2013 18:50         MDM  Patient care obtained from Dr. Roselyn Bering disposition pending Hour 3 i-stat troponin with plan to d/c if negative. Full HPI and ROS available from his note. Patient has continued to remain CP free since shift change and the second troponin has returned negative. Will discharge patient home with return precautions and advised to f/u with PCP and cardiology regarding today's visit. Patient is agreeable to plan. Patient is stable at time of discharge. Discussed this plan of care with Dr. Lynelle Doctor.         Jeannetta Ellis, PA-C 09/03/13 2301

## 2013-09-03 NOTE — ED Notes (Signed)
Portable x-ray tech at bedside 

## 2013-09-03 NOTE — ED Provider Notes (Signed)
CSN: [id number]     Arrival date & time 09/03/13  1807 History   First MD Initiated Contact with Patient 09/03/13 1821     Chief Complaint  Patient presents with  . Chest Pain    HPI Patient started having intermittent episodes of chest pain today off and on since around lunchtime. The episodes would last a few minutes at a time. The pain was sharp in the center and left chest. It did not radiate. He did not have any jaw pain, nausea or shortness of breath. Patient had been at the gym today working out without any difficulty. Because of the persistent symptoms the patient decided to get his blood pressure checked. He went to an EMS station. They noted him to have PVCs and an elevated blood pressure of 200/120. The patient was given 324 mg of aspirin and a sublingual nitroglycerin. Patient denies any pain currently. He denies any fevers cough and shortness of breath. He does not have any known history of heart disease.  Past Medical History  Diagnosis Date  . Neuropathy   . Prediabetes   . HTN (hypertension)   . Hyperlipemia   . Melanoma    Past Surgical History  Procedure Laterality Date  . Foot surgery     Family History  Problem Relation Age of Onset  . Heart attack Mother   . Emphysema Father    History  Substance Use Topics  . Smoking status: Never Smoker   . Smokeless tobacco: Not on file  . Alcohol Use: No    Review of Systems  All other systems reviewed and are negative.    Allergies  Penicillins  Home Medications   Current Outpatient Rx  Name  Route  Sig  Dispense  Refill  . aspirin 81 MG tablet   Oral   Take 81 mg by mouth daily.         Marland Kitchen b complex vitamins tablet   Oral   Take 1 tablet by mouth daily.         . Biotin 1000 MCG tablet   Oral   Take 1,000 mcg by mouth daily.         . Cinnamon 500 MG capsule   Oral   Take 500 mg by mouth daily.         . Coenzyme Q10 (CO Q 10 PO)   Oral   Take 1 tablet by mouth daily.         Marland Kitchen  gabapentin (NEURONTIN) 800 MG tablet   Oral   Take 800 mg by mouth 3 (three) times daily.         Marland Kitchen gemfibrozil (LOPID) 600 MG tablet   Oral   Take 600 mg by mouth 2 (two) times daily.         . Ginkgo Biloba 40 MG TABS   Oral   Take 120 mg by mouth daily.         . Glucosamine-Chondroit-Vit C-Mn (GLUCOSAMINE CHONDR 1500 COMPLX) CAPS   Oral   Take 1 capsule by mouth 2 (two) times daily.         Marland Kitchen lisinopril (PRINIVIL,ZESTRIL) 20 MG tablet   Oral   Take 20 mg by mouth daily.         . milk thistle 175 MG tablet   Oral   Take 175 mg by mouth daily.         . Red Yeast Rice 600 MG CAPS   Oral   Take  600 mg by mouth daily.         . traMADol (ULTRAM) 50 MG tablet   Oral   Take 50-200 mg by mouth every 8 (eight) hours as needed for pain.          . vitamin B-12 (CYANOCOBALAMIN) 1000 MCG tablet   Oral   Take 1,000 mcg by mouth daily.          BP 166/78  Pulse 62  Temp(Src) 98.4 F (36.9 C) (Oral)  Resp 16  Wt 182 lb (82.555 kg)  BMI 25.4 kg/m2  SpO2 99% Physical Exam  Nursing note and vitals reviewed. Constitutional: He appears well-developed and well-nourished. No distress.  HENT:  Head: Normocephalic and atraumatic.  Right Ear: External ear normal.  Left Ear: External ear normal.  Eyes: Conjunctivae are normal. Right eye exhibits no discharge. Left eye exhibits no discharge. No scleral icterus.  Neck: Neck supple. No tracheal deviation present.  Cardiovascular: Normal rate, regular rhythm and intact distal pulses.   Pulmonary/Chest: Effort normal and breath sounds normal. No stridor. No respiratory distress. He has no wheezes. He has no rales.  Abdominal: Soft. Bowel sounds are normal. He exhibits no distension. There is no tenderness. There is no rebound and no guarding.  Musculoskeletal: He exhibits no edema and no tenderness.  Neurological: He is alert. He has normal strength. No sensory deficit. Cranial nerve deficit:  no gross defecits  noted. He exhibits normal muscle tone. He displays no seizure activity. Coordination normal.  Skin: Skin is warm and dry. No rash noted.  Psychiatric: He has a normal mood and affect.    ED Course  Procedures (including critical care time) Labs Review Labs Reviewed  CBC - Abnormal; Notable for the following:    RBC 4.17 (*)    HCT 37.2 (*)    Platelets 125 (*)    All other components within normal limits  COMPREHENSIVE METABOLIC PANEL - Abnormal; Notable for the following:    GFR calc non Af Amer 56 (*)    GFR calc Af Amer 65 (*)    All other components within normal limits  PROTIME-INR  APTT  POCT I-STAT TROPONIN I   Imaging Review Dg Chest Portable 1 View  09/03/2013   CLINICAL DATA:  Chest pain  EXAM: PORTABLE CHEST - 1 VIEW  COMPARISON:  06/05/2012  FINDINGS: Cardiomediastinal silhouette is stable. No acute infiltrate or pleural effusion. No pulmonary edema. Bony thorax is unremarkable.  IMPRESSION: No active disease.   Electronically Signed   By: Natasha Mead M.D.   On: 09/03/2013 18:50    EKG Interpretation     Ventricular Rate:  63 PR Interval:  173 QRS Duration: 98 QT Interval:  405 QTC Calculation: 415 R Axis:   70 Text Interpretation:  Sinus rhythm Abnormal R-wave progression, early transition No significant change since last tracing            MDM  Pt exercises regularly without chest pain.  The symptoms today were atypical.  Heart Score of 3. Initial troponin was normal.  Plan on second marker.  If normal will have patient follow up with PCP to discuss his blood pressure medications and possible stress testing as an outpatient.    Celene Kras, MD 09/03/13 2019

## 2013-09-17 ENCOUNTER — Ambulatory Visit (INDEPENDENT_AMBULATORY_CARE_PROVIDER_SITE_OTHER): Payer: BC Managed Care – PPO | Admitting: Cardiology

## 2013-09-17 ENCOUNTER — Encounter: Payer: Self-pay | Admitting: Cardiology

## 2013-09-17 VITALS — BP 170/90 | HR 69 | Ht 71.0 in | Wt 188.0 lb

## 2013-09-17 DIAGNOSIS — R079 Chest pain, unspecified: Secondary | ICD-10-CM | POA: Insufficient documentation

## 2013-09-17 DIAGNOSIS — I160 Hypertensive urgency: Secondary | ICD-10-CM

## 2013-09-17 DIAGNOSIS — I10 Essential (primary) hypertension: Secondary | ICD-10-CM

## 2013-09-17 MED ORDER — AMLODIPINE BESYLATE 5 MG PO TABS: 5.0000 mg | ORAL_TABLET | Freq: Every day | ORAL | Status: DC

## 2013-09-17 NOTE — Progress Notes (Signed)
Patient ID: VINCENZO STAVE, male   DOB: 01-28-1946, 67 y.o.   MRN: [id number]     Patient Name: Andrew Church Date of Encounter: 09/17/2013  Primary Care Provider:  No primary provider on file. Primary Cardiologist:  Tobias Alexander, H  Problem List   Past Medical History  Diagnosis Date  . Neuropathy   . Prediabetes   . HTN (hypertension)   . Hyperlipemia   . Melanoma    Past Surgical History  Procedure Laterality Date  . Foot surgery      Allergies  Allergies  Allergen Reactions  . Penicillins Rash    HPI  67 year old gentleman with h/o hypertension, HLP, IGT, who went to the ER for intermittent episodes of chest pain while being at rest at home. He reports that he was exrcising that day for an hour in the gym without any symptoms. He later went shopping to [facility] where he developed episodes of chest pain lasting a few minutes at a time. The pain was sharp in the center and left chest. It did not radiate. He did not have any jaw pain, nausea or shortness of breath.  The patient found paramedics that check his BP ant it was 200/110. In the ambulance he was told that he had very frequent PVCs. It was elevated in the ER as well.  He has been taking Lisinopril for the last 10 years. He denies palpitations or syncope.   Home Medications  Prior to Admission medications   Medication Sig Start Date End Date Taking? Authorizing Provider  aspirin 81 MG tablet Take 81 mg by mouth daily.   Yes Historical Provider, MD  b complex vitamins tablet Take 1 tablet by mouth daily.   Yes Historical Provider, MD  Betaine, Trimethylglycine, 500 MG CAPS Take 2 capsules by mouth daily.   Yes Historical Provider, MD  Biotin 1000 MCG tablet Take 1,000 mcg by mouth daily.   Yes Historical Provider, MD  Cinnamon 500 MG capsule Take 500 mg by mouth daily.   Yes Historical Provider, MD  Coenzyme Q10 (CO Q 10 PO) Take 1 tablet by mouth daily.   Yes Historical Provider, MD  gabapentin  (NEURONTIN) 800 MG tablet Take 800 mg by mouth 3 (three) times daily.   Yes Historical Provider, MD  gemfibrozil (LOPID) 600 MG tablet Take 600 mg by mouth 2 (two) times daily.   Yes Historical Provider, MD  Ginkgo Biloba 40 MG TABS Take 120 mg by mouth daily.   Yes Historical Provider, MD  Glucosamine-Chondroit-Vit C-Mn (GLUCOSAMINE CHONDR 1500 COMPLX) CAPS Take 1 capsule by mouth 2 (two) times daily.   Yes Historical Provider, MD  lisinopril (PRINIVIL,ZESTRIL) 20 MG tablet Take 20 mg by mouth daily.   Yes Historical Provider, MD  milk thistle 175 MG tablet Take 175 mg by mouth daily.   Yes Historical Provider, MD  NON FORMULARY Blood sugar extract 1 tab po qd   Yes Historical Provider, MD  Omega-3 Fatty Acids (FISH OIL PO) Take 1 tablet by mouth daily.   Yes Historical Provider, MD  Red Yeast Rice 600 MG CAPS Take 600 mg by mouth daily.   Yes Historical Provider, MD  traMADol (ULTRAM) 50 MG tablet Take 50-200 mg by mouth every 8 (eight) hours as needed for pain.    Yes Historical Provider, MD  vitamin B-12 (CYANOCOBALAMIN) 1000 MCG tablet Take 1,000 mcg by mouth daily.   Yes Historical Provider, MD    Family History  Family History  Problem Relation Age of Onset  . Heart attack Mother   . Emphysema Father     Social History  History   Social History  . Marital Status: Married    Spouse Name: N/A    Number of Children: N/A  . Years of Education: N/A   Occupational History  . Not on file.   Social History Main Topics  . Smoking status: Never Smoker   . Smokeless tobacco: Not on file  . Alcohol Use: No  . Drug Use: No  . Sexual Activity: No   Other Topics Concern  . Not on file   Social History Narrative  . No narrative on file     Review of Systems, as per HPI, otherwise negative General:  No chills, fever, night sweats or weight changes.  Cardiovascular:  No chest pain, dyspnea on exertion, edema, orthopnea, palpitations, paroxysmal nocturnal  dyspnea. Dermatological: No rash, lesions/masses Respiratory: No cough, dyspnea Urologic: No hematuria, dysuria Abdominal:   No nausea, vomiting, diarrhea, bright red blood per rectum, melena, or hematemesis Neurologic:  No visual changes, wkns, changes in mental status. All other systems reviewed and are otherwise negative except as noted above.  Physical Exam  Blood pressure 170/90, pulse 69, height 5\' 11"  (1.803 m), weight 188 lb (85.276 kg).  General: Pleasant, NAD Psych: Normal affect. Neuro: Alert and oriented X 3. Moves all extremities spontaneously. HEENT: Normal  Neck: Supple without bruits or JVD. Lungs:  Resp regular and unlabored, CTA. Heart: RRR no s3, s4, or murmurs. Abdomen: Soft, non-tender, non-distended, BS + x 4.  Extremities: No clubbing, cyanosis or edema. DP/PT/Radials 2+ and equal bilaterally.  Labs:  No results found for this basename: CKTOTAL, CKMB, TROPONINI,  in the last 72 hours Lab Results  Component Value Date   WBC 5.6 09/03/2013   HGB 13.3 09/03/2013   HCT 37.2* 09/03/2013   MCV 89.2 09/03/2013   PLT 125* 09/03/2013   No results found for this basename: NA, K, CL, CO2, BUN, CREATININE, CALCIUM, LABALBU, PROT, BILITOT, ALKPHOS, ALT, AST, GLUCOSE,  in the last 168 hours No results found for this basename: CHOL, HDL, LDLCALC, TRIG   No results found for this basename: DDIMER   No components found with this basename: POCBNP,   Accessory Clinical Findings  echocardiogram  ECG - NSR, 69 BPM, normal ECG   Assessment & Plan  67 year old male  1. Hypertensive urgency - we will start the patient on 5 mg of amlodipine daily. We will order an exercise treadmill stress test that will assess possible ischemia and BP response to exercise.  2. Chest pain - rather atypical - during episodes of hypertension, non-exertional  3. Lipids - none in records, we will order  4. Hepatitis B Ag positivity - per patient, we will avoid liver affecting drugs    Follow up after the stress test  Lars Masson, MD, [facility] 09/17/2013, 3:37 PM

## 2013-09-17 NOTE — Patient Instructions (Addendum)
Your physician has requested that you have an exercise tolerance test. Please also follow instruction sheet, as given.  Your physician recommends that you schedule a follow-up appointment in: WITH DR. Delton See AFTER TEST  Your physician has recommended you make the following change in your medication:   START AMLODIPINE 5 MG ONCE A DAY  Your physician recommends that you continue on your current medications as directed. Please refer to the Current Medication list given to you today.

## 2013-09-18 ENCOUNTER — Other Ambulatory Visit: Payer: Self-pay | Admitting: *Deleted

## 2013-09-18 DIAGNOSIS — R079 Chest pain, unspecified: Secondary | ICD-10-CM

## 2013-09-18 MED ORDER — AMLODIPINE BESYLATE 5 MG PO TABS: 5.0000 mg | ORAL_TABLET | Freq: Every day | ORAL | Status: DC

## 2013-10-27 ENCOUNTER — Encounter: Payer: Self-pay | Admitting: *Deleted

## 2013-11-02 ENCOUNTER — Ambulatory Visit (INDEPENDENT_AMBULATORY_CARE_PROVIDER_SITE_OTHER): Payer: BC Managed Care – PPO | Admitting: Physician Assistant

## 2013-11-02 DIAGNOSIS — R9439 Abnormal result of other cardiovascular function study: Secondary | ICD-10-CM

## 2013-11-02 DIAGNOSIS — R079 Chest pain, unspecified: Secondary | ICD-10-CM

## 2013-11-02 NOTE — Patient Instructions (Signed)
Your physician has requested that you have a lexiscan myoview. Please follow instruction sheet, as given.

## 2013-11-02 NOTE — Progress Notes (Signed)
Exercise Treadmill Test  Pre-Exercise Testing Evaluation Rhythm: normal sinus  Rate: 74     Test  Exercise Tolerance Test Ordering MD: Tobias Alexander  Interpreting MD: Tereso Newcomer, PA-C  Unique Test No: 1  Treadmill:  1  Indication for ETT: chest pain - rule out ischemia  Contraindication to ETT: No   Stress Modality: exercise - treadmill  Cardiac Imaging Performed: non   Protocol: standard Bruce - maximal  Max BP:  215/114  Max MPHR (bpm):  151 85% MPR (bpm):  128  MPHR obtained (bpm):  136 % MPHR obtained:  90  Reached 85% MPHR (min:sec):  6:15 Total Exercise Time (min-sec):  7:00  Workload in METS:  8.5 Borg Scale: 14  Reason ETT Terminated:  exaggerated hypertensive response    ST Segment Analysis At Rest: normal ST segments - no evidence of significant ST depression With Exercise: significant ischemic ST depression  Other Information Arrhythmia:  No Angina during ETT:  absent (0) Quality of ETT:  diagnostic  ETT Interpretation:  abnormal - evidence of ST depression consistent with ischemia  Comments: Fair exercise capacity. No chest pain. Baseline BP elevated at 170/95.  There was hypertensive BP response to exercise. There was significant ST depression suggestive of ischemia at peak exercise in the inferior and anterolateral leads.  Recommendations: The patient stopped his Lisinopril when he started Amlodipine.  I have asked him to restart the Lisinopril along with his Amlodipine.   He had significant ST changes with exercise but no symptoms of chest pain and he exercised 7 minutes.   Arrange LexiScan Myoview. Discussed with patient. Follow up with Dr. Tobias Alexander as directed. Signed,  Tereso Newcomer, PA-C   11/02/2013 10:42 AM

## 2013-11-06 ENCOUNTER — Ambulatory Visit: Payer: Medicare Other | Admitting: Cardiology

## 2013-11-18 ENCOUNTER — Encounter: Payer: Self-pay | Admitting: Cardiovascular Disease

## 2013-11-18 ENCOUNTER — Ambulatory Visit (HOSPITAL_COMMUNITY): Payer: BC Managed Care – PPO | Attending: Cardiovascular Disease | Admitting: Radiology

## 2013-11-18 VITALS — BP 165/97 | HR 66 | Ht 71.0 in | Wt 190.0 lb

## 2013-11-18 DIAGNOSIS — R9439 Abnormal result of other cardiovascular function study: Secondary | ICD-10-CM | POA: Insufficient documentation

## 2013-11-18 DIAGNOSIS — Z8249 Family history of ischemic heart disease and other diseases of the circulatory system: Secondary | ICD-10-CM | POA: Insufficient documentation

## 2013-11-18 DIAGNOSIS — R079 Chest pain, unspecified: Secondary | ICD-10-CM | POA: Insufficient documentation

## 2013-11-18 DIAGNOSIS — E119 Type 2 diabetes mellitus without complications: Secondary | ICD-10-CM | POA: Insufficient documentation

## 2013-11-18 DIAGNOSIS — E785 Hyperlipidemia, unspecified: Secondary | ICD-10-CM | POA: Insufficient documentation

## 2013-11-18 DIAGNOSIS — I10 Essential (primary) hypertension: Secondary | ICD-10-CM | POA: Insufficient documentation

## 2013-11-18 MED ORDER — TECHNETIUM TC 99M SESTAMIBI GENERIC - CARDIOLITE
30.0000 | Freq: Once | INTRAVENOUS | Status: AC | PRN
  Administered 2013-11-18: 30 via INTRAVENOUS

## 2013-11-18 MED ORDER — TECHNETIUM TC 99M SESTAMIBI GENERIC - CARDIOLITE
10.0000 | Freq: Once | INTRAVENOUS | Status: AC | PRN
  Administered 2013-11-18: 10 via INTRAVENOUS

## 2013-11-18 MED ORDER — REGADENOSON 0.4 MG/5ML IV SOLN
0.4000 mg | Freq: Once | INTRAVENOUS | Status: AC
  Administered 2013-11-18: 0.4 mg via INTRAVENOUS

## 2013-11-18 NOTE — Progress Notes (Signed)
[facility] [address] [place], [place] [address] [phone]    Cardiology Nuclear Med Study  KYPTON ELTRINGHAM is a 68 y.o. male     MRN : [id number]     DOB: 28-Mar-1946  Procedure Date: 11/18/2013  Nuclear Med Background Indication for Stress Test:  Evaluation for Ischemia and Abnormal GXT History:  No known CAD Cardiac Risk Factors: Family History - CAD, Hypertension, Lipids and NIDDM  Symptoms:  Chest Pain   Nuclear Pre-Procedure Caffeine/Decaff Intake:  None NPO After: 8:00pm   Lungs:  clear O2 Sat: 95% on room air. IV 0.9% NS with Angio Cath:  22g  IV Site: R Forearm  IV Started by:  Crissie Figures, RN  Chest Size (in):  44 Cup Size: n/a  Height: 5\' 11"  (1.803 m)  Weight:  190 lb (86.183 kg)  BMI:  Body mass index is 26.51 kg/(m^2). Tech Comments:  n/a    Nuclear Med Study 1 or 2 day study: 1 day  Stress Test Type:  Lexiscan  Reading MD: N/A  Order Authorizing Provider:  Ena Dawley, MD  Resting Radionuclide: Technetium 7m Sestamibi  Resting Radionuclide Dose: 11.0 mCi   Stress Radionuclide:  Technetium 54m Sestamibi  Stress Radionuclide Dose: 33.0 mCi           Stress Protocol Rest HR: 66 Stress HR: 103  Rest BP: 165/97 Stress BP: 169/79  Exercise Time (min): n/a METS: n/a           Dose of Adenosine (mg):  n/a Dose of Lexiscan: 0.4 mg  Dose of Atropine (mg): n/a Dose of Dobutamine: n/a mcg/kg/min (at max HR)  Stress Test Technologist: Glade Lloyd, BS-ES  Nuclear Technologist:  Vedia Pereyra, CNMT     Rest Procedure:  Myocardial perfusion imaging was performed at rest 45 minutes following the intravenous administration of Technetium 3m Sestamibi. Rest ECG: NSR-LVH  Stress Procedure:  The patient received IV Lexiscan 0.4 mg over 15-seconds with concurrent low level exercise and then Technetium 39m Sestamibi was injected at 30-seconds while the patient continued walking one more minute.  Quantitative spect images  were obtained after a 45-minute delay. Stress ECG: No significant change from baseline ECG  QPS Raw Data Images:  Normal; no motion artifact; normal heart/lung ratio. Stress Images:  Normal homogeneous uptake in all areas of the myocardium. Rest Images:  Normal homogeneous uptake in all areas of the myocardium. Subtraction (SDS):  No evidence of ischemia. Transient Ischemic Dilatation (Normal <1.22):  1.05 Lung/Heart Ratio (Normal <0.45):  0.29  Quantitative Gated Spect Images QGS EDV:  93 ml QGS ESV:  31 ml  Impression Exercise Capacity:  Lexiscan with low level exercise. BP Response:  resting hypertension Clinical Symptoms:  No significant symptoms noted. ECG Impression:  No significant ECG changes with Lexiscan. Comparison with Prior Nuclear Study: No previous nuclear study performed  Overall Impression:  Normal stress nuclear study.  LV Ejection Fraction: 67%.  LV Wall Motion:  NL LV Function; NL Wall Motion   Sanda Klein, MD, [facility] [phone] office [phone] pager

## 2013-11-19 ENCOUNTER — Encounter: Payer: Self-pay | Admitting: Physician Assistant

## 2013-11-23 ENCOUNTER — Encounter: Payer: Self-pay | Admitting: Cardiology

## 2013-11-23 ENCOUNTER — Telehealth: Payer: Self-pay | Admitting: Cardiology

## 2013-11-23 ENCOUNTER — Ambulatory Visit (INDEPENDENT_AMBULATORY_CARE_PROVIDER_SITE_OTHER): Payer: BC Managed Care – PPO | Admitting: Cardiology

## 2013-11-23 VITALS — BP 148/86 | HR 72 | Ht 71.0 in | Wt 190.5 lb

## 2013-11-23 DIAGNOSIS — I10 Essential (primary) hypertension: Secondary | ICD-10-CM

## 2013-11-23 DIAGNOSIS — I160 Hypertensive urgency: Secondary | ICD-10-CM

## 2013-11-23 MED ORDER — PREGABALIN 75 MG PO CAPS: 75.0000 mg | ORAL_CAPSULE | Freq: Two times a day (BID) | ORAL | Status: AC

## 2013-11-23 MED ORDER — AMLODIPINE BESYLATE 10 MG PO TABS
10.0000 mg | ORAL_TABLET | Freq: Every day | ORAL | Status: DC
Start: 2013-11-23 — End: 2014-01-06

## 2013-11-23 NOTE — Addendum Note (Signed)
Addended by: Golden Hurter D on: 11/23/2013 09:27 AM   Modules accepted: Orders, Medications

## 2013-11-23 NOTE — Telephone Encounter (Signed)
New message  Patient is returning your call, please call him at work.

## 2013-11-23 NOTE — Telephone Encounter (Signed)
Returned call to patient Dr.Nelson advised needs a bmet ok to have done tomorrow 11/24/12.Also Dr.Nelson prescribed Lyrica today at office visit, need to stop gabapentin.

## 2013-11-23 NOTE — Patient Instructions (Signed)
Increase Amlodipine 10 mg daily   Start Lyrica 75 mg twice a day    Your physician wants you to follow-up in: 6 months You will receive a reminder letter in the mail two months in advance. If you don't receive a letter, please call our office to schedule the follow-up appointment.

## 2013-11-23 NOTE — Progress Notes (Signed)
Patient ID: JASSEN SARVER, male   DOB: 1945/11/30, 68 y.o.   MRN: [id number]    Patient Name: Andrew Church Date of Encounter: 11/23/2013  Primary Care Provider:  No primary provider on file. Primary Cardiologist:  Ena Dawley, H  Problem List   Past Medical History  Diagnosis Date  . Neuropathy   . Prediabetes   . HTN (hypertension)   . Hyperlipemia   . Melanoma   . Hx of cardiovascular stress test     a.  Lexiscan Myoview (11/2013): EF 62%, no ischemia; normal study.   Past Surgical History  Procedure Laterality Date  . Foot surgery      Allergies  Allergies  Allergen Reactions  . Penicillins Rash    HPI  68 year old gentleman with h/o hypertension, HLP, IGT, who went to the ER for intermittent episodes of chest pain while being at rest at home. He reports that he was exercising that day for an hour in the gym without any symptoms. He later went shopping to [facility] where he developed episodes of chest pain lasting a few minutes at a time. The pain was sharp in the center and left chest. It did not radiate. He did not have any jaw pain, nausea or shortness of breath.  The patient found paramedics that check his BP ant it was 200/110. In the ambulance he was told that he had very frequent PVCs. It was elevated in the ER as well.  He has been taking Lisinopril for the last 10 years. He denies palpitations or syncope.   Positive exercise treadmill stress test, with hypertensive response, negative lexiscan nuclear stress test. Improvement of symptoms with improved  BP control. He states that his uncontrolled BP started once he discontinued Lisinopril that he has been taking for years. (based on elevated Crea ). He complains of LE neuropathy.  Home Medications  amLODipine (NORVASC) 5 MG tablet aspirin 81 MG tablet, Take 81 mg by mouth daily. Coenzyme Q10 (CO Q 10 PO), Take 1 tablet by mouth daily gabapentin (NEURONTIN) 800 MG tablet gemfibrozil (LOPID) 600 MG  tablet lisinopril (PRINIVIL,ZESTRIL) 20 MG tablet, Take 20 mg by mouth daily Red Yeast Rice 600 MG CAPS, Take 600 mg by mouth daily  Family History  Family History  Problem Relation Age of Onset  . Heart attack Mother   . Emphysema Father     Social History  History   Social History  . Marital Status: Married    Spouse Name: N/A    Number of Children: N/A  . Years of Education: N/A   Occupational History  . Not on file.   Social History Main Topics  . Smoking status: Never Smoker   . Smokeless tobacco: Not on file  . Alcohol Use: No  . Drug Use: No  . Sexual Activity: No   Other Topics Concern  . Not on file   Social History Narrative  . No narrative on file     Review of Systems, as per HPI, otherwise negative General:  No chills, fever, night sweats or weight changes.  Cardiovascular:  No chest pain, dyspnea on exertion, edema, orthopnea, palpitations, paroxysmal nocturnal dyspnea. Dermatological: No rash, lesions/masses Respiratory: No cough, dyspnea Urologic: No hematuria, dysuria Abdominal:   No nausea, vomiting, diarrhea, bright red blood per rectum, melena, or hematemesis Neurologic:  No visual changes, wkns, changes in mental status. All other systems reviewed and are otherwise negative except as noted above.  Physical Exam  Blood pressure  148/86, pulse 72, height 5\' 11"  (1.803 m), weight 190 lb 8 oz (86.41 kg).  General: Pleasant, NAD Psych: Normal affect. Neuro: Alert and oriented X 3. Moves all extremities spontaneously. HEENT: Normal  Neck: Supple without bruits or JVD. Lungs:  Resp regular and unlabored, CTA. Heart: RRR no s3, s4, or murmurs. Abdomen: Soft, non-tender, non-distended, BS + x 4.  Extremities: No clubbing, cyanosis or edema. DP/PT/Radials 2+ and equal bilaterally.  Labs:  No results found for this basename: CKTOTAL, CKMB, TROPONINI,  in the last 72 hours Lab Results  Component Value Date   WBC 5.6 09/03/2013   HGB 13.3  09/03/2013   HCT 37.2* 09/03/2013   MCV 89.2 09/03/2013   PLT 125* 09/03/2013   No results found for this basename: NA, K, CL, CO2, BUN, CREATININE, CALCIUM, LABALBU, PROT, BILITOT, ALKPHOS, ALT, AST, GLUCOSE,  in the last 168 hours No results found for this basename: CHOL,  HDL,  LDLCALC,  TRIG   No results found for this basename: DDIMER   No components found with this basename: POCBNP,   Accessory Clinical Findings  echocardiogram  ECG - NSR, 69 BPM, normal ECG  Exercise treadmill stress test: 11/02/2013  ST Segment Analysis At Rest: normal ST segments - no evidence of significant ST depression With Exercise: significant ischemic ST depression  Other Information Arrhythmia:  No Angina during ETT:  absent (0) Quality of ETT:  diagnostic  ETT Interpretation:  abnormal - evidence of ST depression consistent with ischemia  Comments: Fair exercise capacity. No chest pain. Baseline BP elevated at 170/95.  There was hypertensive BP response to exercise. There was significant ST depression suggestive of ischemia at peak exercise in the inferior and anterolateral leads.  Recommendations: The patient stopped his Lisinopril when he started Amlodipine.  I have asked him to restart the Lisinopril along with his Amlodipine.   He had significant ST changes with exercise but no symptoms of chest pain and he exercised 7 minutes.   Arrange LexiScan Myoview. Discussed with patient. Follow up with Dr. Ena Dawley as directed. Signed,  Richardson Dopp, PA-C   11/02/2013 10:42 AM       Assessment & Plan  68 year old male  1. Hypertensive urgency with typical chest pain and a positive exercise treadmill stress test with hypertensive response to exercise, started on 5 mg of amlodipine daily, negative Lexiscan nuclear stress test. No more chest pain, however BP still elevated today 148, per patient 150-165 mmHg at home. We will continue Lisinopril 20 mg po daily and increase amlodipine  to 10 mg po daily. The re was a concern about elevated creatinine, we will check today.  The patient will provide [place] with a diary of his BP measurements at home and email them to [place].   2. Lipids - none in records, but drawn at the [facility] PCP and at [facility], the patient will email [place] the results  4. Hepatitis B Ag positivity - per patient, we will avoid liver affecting drugs, currently on red yeast rice. He is advised not to combine with statins.  5. LE neuropathy - we will start on Lyrica 75 mg po bid  Follow up in 6 months.  Ena Dawley, Lemmie Evens, MD, [facility] 11/23/2013, 8:11 AM

## 2013-11-24 ENCOUNTER — Other Ambulatory Visit (INDEPENDENT_AMBULATORY_CARE_PROVIDER_SITE_OTHER): Payer: BC Managed Care – PPO

## 2013-11-24 DIAGNOSIS — I160 Hypertensive urgency: Secondary | ICD-10-CM

## 2013-11-24 DIAGNOSIS — I10 Essential (primary) hypertension: Secondary | ICD-10-CM

## 2013-11-25 ENCOUNTER — Telehealth: Payer: Self-pay | Admitting: Cardiology

## 2013-11-25 LAB — BASIC METABOLIC PANEL
BUN: 24 mg/dL — ABNORMAL HIGH (ref 6–23)
CO2: 26 mEq/L (ref 19–32)
Calcium: 9.4 mg/dL (ref 8.4–10.5)
Chloride: 106 mEq/L (ref 96–112)
Creatinine, Ser: 1.4 mg/dL (ref 0.4–1.5)
GFR: 55.66 mL/min — ABNORMAL LOW (ref >60.00)
Glucose, Bld: 84 mg/dL (ref 70–99)
Potassium: 4.3 mEq/L (ref 3.5–5.1)
Sodium: 138 mEq/L (ref 135–145)

## 2013-11-25 NOTE — Telephone Encounter (Signed)
Walk In pt Form " [facility]" Dropped Off gave to lynn Covering Dr.Nelson

## 2013-12-29 ENCOUNTER — Other Ambulatory Visit: Payer: Self-pay | Admitting: Family Medicine

## 2013-12-29 ENCOUNTER — Ambulatory Visit
Admission: RE | Admit: 2013-12-29 | Discharge: 2013-12-29 | Disposition: A | Discharge status: Home / Self Care | Payer: BC Managed Care – PPO | Source: Ambulatory Visit | Attending: Family Medicine | Admitting: Family Medicine

## 2013-12-29 DIAGNOSIS — J189 Pneumonia, unspecified organism: Secondary | ICD-10-CM

## 2013-12-29 DIAGNOSIS — R0600 Dyspnea, unspecified: Secondary | ICD-10-CM

## 2013-12-30 ENCOUNTER — Other Ambulatory Visit: Payer: Self-pay | Admitting: Family Medicine

## 2013-12-30 DIAGNOSIS — IMO0002 Reserved for concepts with insufficient information to code with codable children: Secondary | ICD-10-CM

## 2013-12-30 DIAGNOSIS — R229 Localized swelling, mass and lump, unspecified: Principal | ICD-10-CM

## 2013-12-31 ENCOUNTER — Ambulatory Visit
Admission: RE | Admit: 2013-12-31 | Discharge: 2013-12-31 | Disposition: A | Discharge status: Home / Self Care | Payer: BC Managed Care – PPO | Source: Ambulatory Visit | Attending: Family Medicine | Admitting: Family Medicine

## 2013-12-31 DIAGNOSIS — IMO0002 Reserved for concepts with insufficient information to code with codable children: Secondary | ICD-10-CM

## 2013-12-31 DIAGNOSIS — R229 Localized swelling, mass and lump, unspecified: Principal | ICD-10-CM

## 2013-12-31 MED ORDER — IOHEXOL 300 MG/ML  SOLN
75.0000 mL | Freq: Once | INTRAMUSCULAR | Status: AC | PRN
  Administered 2013-12-31: 75 mL via INTRAVENOUS

## 2014-01-04 ENCOUNTER — Encounter: Payer: BC Managed Care – PPO | Admitting: Cardiothoracic Surgery

## 2014-01-05 ENCOUNTER — Institutional Professional Consult (permissible substitution) (INDEPENDENT_AMBULATORY_CARE_PROVIDER_SITE_OTHER): Payer: BC Managed Care – PPO | Admitting: Cardiothoracic Surgery

## 2014-01-05 ENCOUNTER — Other Ambulatory Visit: Payer: Self-pay | Admitting: *Deleted

## 2014-01-05 VITALS — BP 123/74 | HR 96 | Resp 20 | Ht 71.0 in | Wt 190.0 lb

## 2014-01-05 DIAGNOSIS — R222 Localized swelling, mass and lump, trunk: Secondary | ICD-10-CM

## 2014-01-05 DIAGNOSIS — R918 Other nonspecific abnormal finding of lung field: Secondary | ICD-10-CM

## 2014-01-05 NOTE — Progress Notes (Signed)
[address]       [place] [address]             [phone]                    Dionisio E Pesce Evans Medical Record [id number] Date of Birth: 15-Jun-1946  Referring: Gaynelle Arabian, MD Primary Care: Simona Huh, MD  Chief Complaint:    Chief Complaint  Patient presents with  . Lung Mass    Surgical eval on left lower lung mass, Chest CT 12/31/13    History of Present Illness:    Andrew Church 68 y.o. male is seen in the office  today for left lung mass. Patient is life long non smoker who presents with increasing chest discomfort and SOB of several month duration but getting worse last 2 weeks. He had upper respiratory symptoms  With cough but no hemoptyses.  Several months ago he was elevated by cardiology for chest discomfort and hypertension, a  with Myoview stress test was done. Chest xray was done leading to CT of the chest. Patient referred for lung mass.   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   Zubrod Score: At the time of surgery this patient's most appropriate activity status/level should be described as: []     0    Normal activity, no symptoms [x]     1    Restricted in physical strenuous activity but ambulatory, able to do out light work []     2    Ambulatory and capable of self care, unable to do work activities, up and about               >50 % of waking hours                              []     3    Only limited self care, in bed greater than 50% of waking hours []     4    Completely disabled, no self care, confined to bed or chair []     5    Moribund   Past Medical History  Diagnosis Date  . Neuropathy   . Prediabetes   . HTN (hypertension)   . Hyperlipemia   . Melanoma     04/2003  . Hx of cardiovascular stress test     a.  Lexiscan  Myoview (11/2013): EF 62%, no ischemia; normal study.  . Fatty liver   . Diabetes mellitus     10/2004  . ED (erectile dysfunction)   . Hepatitis B carrier     per [facility] 2013  . CKD (chronic kidney disease)     stage 3    Past Surgical History  Procedure Laterality Date  . Foot surgery    . Right ankle fracture      12/2005  . Lasik    . Hydrocele excision      Family History  Problem Relation Age of Onset  . Heart attack Mother   . Emphysema and lung cancer  Age 109 Father     History   Social History  . Marital Status: Married    Spouse Name: N/A    Number of Children: N/A  . Years of  Education: N/A   Occupational History  . Works full time at [employer]  . Smoking status: Never Smoker   . Smokeless tobacco: Never Used  . Alcohol Use: No  . Drug Use: No  . Sexual Activity: No   Other Topics Concern  . Not on file   Social History Narrative  . No narrative on file    History  Smoking status  . Never Smoker   Smokeless tobacco  . Never Used    History  Alcohol Use No     Allergies  Allergen Reactions  . Lisinopril Other (See Comments)    Erectile dysfunction  . Naprosyn [Naproxen] Other (See Comments)    Elevated blood preasure  . Viagra [Sildenafil Citrate] Other (See Comments)    Nasal stuffy  . Penicillins Rash    Current Outpatient Prescriptions  Medication Sig Dispense Refill  . amLODipine (NORVASC) 10 MG tablet Take 1 tablet (10 mg total) by mouth daily.  30 tablet  6  . aspirin 81 MG tablet Take 81 mg by mouth daily.      Marland Kitchen b complex vitamins tablet Take 1 tablet by mouth daily.      . Betaine, Trimethylglycine, 500 MG CAPS Take 2 capsules by mouth daily.      . Biotin 1000 MCG tablet Take 1,000 mcg by mouth daily.      . Cinnamon 500 MG capsule Take 500 mg by mouth daily.      . Coenzyme Q10 (CO Q 10 PO) Take 1 tablet by mouth daily.      Marland Kitchen doxycycline (VIBRAMYCIN) 100 MG capsule Take 100 mg by mouth 2 (two)  times daily.      Marland Kitchen gemfibrozil (LOPID) 600 MG tablet Take 600 mg by mouth 2 (two) times daily.      . Ginkgo Biloba 40 MG TABS Take 120 mg by mouth daily.      Marland Kitchen glipiZIDE (GLUCOTROL) 5 MG tablet Take by mouth daily before breakfast.      . Glucosamine-Chondroit-Vit C-Mn (GLUCOSAMINE CHONDR 1500 COMPLX) CAPS Take 1 capsule by mouth 2 (two) times daily.      Marland Kitchen lisinopril (PRINIVIL,ZESTRIL) 20 MG tablet Take 20 mg by mouth daily.      . milk thistle 175 MG tablet Take 175 mg by mouth daily.      . NON FORMULARY Blood sugar extract 1 tab po qd      . Omega-3 Fatty Acids (FISH OIL PO) Take 1 tablet by mouth daily.      . pregabalin (LYRICA) 75 MG capsule Take 1 capsule (75 mg total) by mouth 2 (two) times daily.  60 capsule  6  . Red Yeast Rice 600 MG CAPS Take 600 mg by mouth daily.      . traMADol (ULTRAM) 50 MG tablet Take 50-200 mg by mouth every 8 (eight) hours as needed for pain.        No current facility-administered medications for this visit.     Review of Systems:     Cardiac Review of Systems: Y or N  Chest Pain [  y  ]  Resting SOB [  y ] Exertional SOB  [ y ]  Orthopnea [ n ]   Pedal Edema [ n  ]    Palpitations [n  ] Syncope  [ n ]   Presyncope [ n  ]  General Review of Systems: [Y] = yes [  ]=no Constitional: recent weight  change Blue.Reese  ];  Wt loss over the last 3 months [ 9 lbs then gained some back  ] anorexia [  ]; fatigue [  ]; nausea [  ]; night sweats [  ]; fever [  ]; or chills [  ];          Dental: poor dentition[  ]; Last Dentist visit:   Eye : blurred vision [  ]; diplopia [   ]; vision changes [  ];  Amaurosis fugax[  ]; Resp: cough [ y ];  wheezing[y  ];  hemoptysis[ n ]; shortness of breath[ y ]; paroxysmal nocturnal dyspnea[ y ]; dyspnea on exertion[y  ]; or orthopnea[  ];  GI:  gallstones[  ], vomiting[  ];  dysphagia[  ]; melena[  ];  hematochezia [  ]; heartburn[  ];   Hx of  Colonoscopy[ y ]; GU: kidney stones [  ]; hematuria[  ];   dysuria [  ];  nocturia[   ];  history of     obstruction [  ]; urinary frequency [  ]             Skin: rash, swelling[  ];, hair loss[  ];  peripheral edema[  ];  or itching[  ]; Musculosketetal: myalgias[  ];  joint swelling[  ];  joint erythema[  ];  joint pain[  ];  back pain[  ];  Heme/Lymph: bruising[  ];  bleeding[  ];  anemia[  ];  Neuro: TIA[  ];  headaches[  ];  stroke[  ];  vertigo[  ];  seizures[  ];   Paresthesias[neuropath in feet severe  ];  difficulty walking[  ];  Psych:depression[  ]; anxiety[  ];  Endocrine: diabetes[n  ];  thyroid dysfunction[  ];  Immunizations: Flu up to date [ y ]; Pneumococcal up to date Blue.Reese  ];  Other:  Physical Exam: BP 123/74  Pulse 96  Resp 20  Ht 5\' 11"  (1.803 m)  Wt 190 lb (86.183 kg)  BMI 26.51 kg/m2  SpO2 93%  PHYSICAL EXAMINATION:  General appearance: alert, cooperative, appears stated age and mild distress Neurologic: intact Heart: regular rate and rhythm, S1, S2 normal, no murmur, click, rub or gallop Lungs: clear to auscultation bilaterally Abdomen: soft, non-tender; bowel sounds normal; no masses,  no organomegaly Extremities: extremities normal, atraumatic, no cyanosis or edema and Homans sign is negative, no sign of DVT Rt groin clear of palpable masses, no other palpable adenopathy, rt supraclavicular fossa full but no palpable mass  Diagnostic Studies & Laboratory data:     Recent Radiology Findings:   Dg Chest 2 View  12/29/2013   CLINICAL DATA:  Cough, congestion, shortness of breath for 2 weeks, evaluate for pneumonia  EXAM: CHEST  2 VIEW  COMPARISON:  DG CHEST 1V PORT dated 09/03/2013; DG CHEST 2 VIEW dated 06/05/2012; DG CHEST 2 VIEW dated 06/07/2011  FINDINGS: Grossly unchanged borderline enlarged cardiac silhouette. Interval development of an approximately 4.9 x 4.2 cm apparent mass within the superior segment of the left lower lobe, best appreciated on the provided lateral radiograph. This finding is associated with apparent nodular thickening of  the contralateral right paratracheal stripe. The lungs remain hyperexpanded with flattening of bilateral hemidiaphragms and mild diffuse slightly nodular thickening of the pulmonary interstitium. Interval development of trace bilateral pleural effusions. Worsening left basilar heterogeneous/consolidative opacities. No evidence of edema. No pneumothorax. Grossly unchanged bones.  IMPRESSION: 1. Interval development of an approximately 4.9 cm  mass within the superior segment left lower lobe with possible adenopathy within in the contralateral right mediastinum. Further evaluation with contrast-enhanced chest CT is recommended. 2. Worsening left basilar/retrocardiac opacities, atelectasis versus infiltrate. 3. Interval development of trace bilateral pleural effusions. No evidence of edema. These results will be called to the ordering clinician or representative by the Radiologist Assistant, and communication documented in the PACS Dashboard.   Electronically Signed   By: Sandi Mariscal M.D.   On: 12/29/2013 16:56   Ct Chest W Contrast  12/31/2013   CLINICAL DATA:  Abnormal chest radiograph with possible adenopathy, chest pain, shortness of breath.  EXAM: CT CHEST WITH CONTRAST  TECHNIQUE: Multidetector CT imaging of the chest was performed during intravenous contrast administration.  CONTRAST:  26mL OMNIPAQUE IOHEXOL 300 MG/ML  SOLN  COMPARISON:  Chest radiographs dated 12/29/2013.  FINDINGS: 5.0 x 4.4 cm central left lower lobe mass (series 3/image 39), suspicious for primary bronchogenic neoplasm. Mass extends into the left hilar region (series 2/ image 34). Narrowing of the left lower lobe bronchus.  Small right and trace left pleural effusions.  No pneumothorax.  Visualized thyroid is unremarkable.  The heart is normal in size. No pericardial effusion. Mild coronary atherosclerosis in the LAD. No evidence of thoracic aortic aneurysm.  Associated mediastinal lymphadenopathy, including:  --8 mm short axis right  supraclavicular node (series 2/image 9)  --20 mm short axis high right paratracheal node (series 2/image 21)  --13 mm short axis right hilar node (series 2/image 32)  --31 mm short axis subcarinal node (series 2/image 34)  Visualized upper abdomen is unremarkable. Bilateral adrenal glands are within normal limits.  Degenerative changes of the visualized thoracolumbar spine. Probable bone island at T12 (sagittal image 79).  IMPRESSION: 5.0 cm central left lower lobe mass, suspicious for primary bronchogenic neoplasm.  Associated thoracic lymphadenopathy, as described above, including a prominent 8 mm short axis right supraclavicular node.  Small right and trace left pleural effusions.  For tissue diagnosis, consider bronchoscopy or percutaneous sampling of the right supraclavicular node.  These results will be called to the ordering clinician or representative by the Radiologist Assistant, and communication documented in the PACS Dashboard.   Electronically Signed   By: Julian Hy M.D.   On: 12/31/2013 14:36      Recent Lab Findings: Lab Results  Component Value Date   WBC 5.6 09/03/2013   HGB 13.3 09/03/2013   HCT 37.2* 09/03/2013   PLT 125* 09/03/2013   GLUCOSE 84 11/24/2013   ALT 33 09/03/2013   AST 27 09/03/2013   NA 138 11/24/2013   K 4.3 11/24/2013   CL 106 11/24/2013   CREATININE 1.4 11/24/2013   BUN 24* 11/24/2013   CO2 26 11/24/2013   INR 1.04 09/03/2013   PATH:Date Received: 05/04/2003  FINAL DIAGNOSIS  MICROSCOPIC EXAMINATION AND DIAGNOSIS  RIGHT GROIN SKIN AND SENTINEL LYMPH NODES, WIDE EXCISION: MALIGNANT MELANOMA. SEE TABLE. THREE SENTINEL LYMPH NODES: MELANOCYTES WITHIN LYMPH NODE CAPSULE. SEE COMMENT.  MELANOMA TABLE- Type: Superficial spreading Clark' s Level: IV Breslow' s measurement: 2.5 mm Host response: Slight Mitosis: Not numerous Regression: Not seen Ulceration: Not seen Satellitosis: Not seen Vascular Invasion: See comment Margin: Free of tumor Lymph  nodes: # examined 3; # positive 0 TNM: pT3a, pN0, pMX Comment: At the base of the tumor within the dermis there are several nests of tumor with clear spaces around the nests consistent with tissue shrinkage artifact rather than vascular space involvement by tumor.  Within the  capsule of one of the sentinel lymph nodes (Slides C and D) there are nests of melanocytic cells with minimal cytologic atypia. These cells stain positive with immunoperoxidase for Melan-A but are negative for HMB45 and S100. These cells have significantly less cytologic atypia than the melanoma and the features favor nevus cells within the lymph node capsule. The remaining two nodes show no metastatic melanoma with H immunoperoxidase. All the immunoperoxidase controls stained appropriately. Case discussed with Dr. Rebekah Chesterfield on 05/07/03. (JDP:caf 05/07/03)  cf Date Reported: 05/07/2003 Chrystie Nose. Saralyn Pilar, MD  Electronically Signed Out By JDP    Assessment / Plan:   Left lung mass with extensive mediastinal adenopathy, with symptomatic sob. I have recommended to patient we proceed with BX soon. PET scan and MRI of the brain are needed. Will precede with Bronch, EBUS poss mediastinoscopy tomorrow to obtain tissue dx. Clinicial Stage 3B lung cancer , but poss mets from previous melanoma.       I spent 55 minutes counseling the patient face to face. The total time spent in the appointment was 80 minutes.  Grace Isaac MD      [address] [place] [address] Office [phone]   Beeper [phone]  01/05/2014 8:46 PM

## 2014-01-06 ENCOUNTER — Encounter (HOSPITAL_COMMUNITY)
Admission: RE | Disposition: A | Discharge status: Home / Self Care | Payer: Self-pay | Source: Ambulatory Visit | Attending: Cardiothoracic Surgery

## 2014-01-06 ENCOUNTER — Other Ambulatory Visit: Payer: Self-pay | Admitting: *Deleted

## 2014-01-06 ENCOUNTER — Encounter (HOSPITAL_COMMUNITY): Payer: Self-pay | Admitting: *Deleted

## 2014-01-06 ENCOUNTER — Ambulatory Visit (HOSPITAL_COMMUNITY)
Admission: RE | Admit: 2014-01-06 | Discharge: 2014-01-06 | Disposition: A | Discharge status: Home / Self Care | Payer: BC Managed Care – PPO | Source: Ambulatory Visit | Attending: Cardiothoracic Surgery | Admitting: Cardiothoracic Surgery

## 2014-01-06 ENCOUNTER — Ambulatory Visit (HOSPITAL_COMMUNITY): Payer: BC Managed Care – PPO

## 2014-01-06 ENCOUNTER — Ambulatory Visit (HOSPITAL_COMMUNITY): Payer: BC Managed Care – PPO | Admitting: Certified Registered"

## 2014-01-06 ENCOUNTER — Encounter (HOSPITAL_COMMUNITY): Payer: BC Managed Care – PPO | Admitting: Certified Registered"

## 2014-01-06 DIAGNOSIS — E785 Hyperlipidemia, unspecified: Secondary | ICD-10-CM | POA: Insufficient documentation

## 2014-01-06 DIAGNOSIS — N183 Chronic kidney disease, stage 3 unspecified: Secondary | ICD-10-CM | POA: Insufficient documentation

## 2014-01-06 DIAGNOSIS — R599 Enlarged lymph nodes, unspecified: Secondary | ICD-10-CM | POA: Insufficient documentation

## 2014-01-06 DIAGNOSIS — R222 Localized swelling, mass and lump, trunk: Secondary | ICD-10-CM | POA: Insufficient documentation

## 2014-01-06 DIAGNOSIS — E119 Type 2 diabetes mellitus without complications: Secondary | ICD-10-CM | POA: Diagnosis not present

## 2014-01-06 DIAGNOSIS — C7800 Secondary malignant neoplasm of unspecified lung: Secondary | ICD-10-CM | POA: Diagnosis not present

## 2014-01-06 DIAGNOSIS — R0602 Shortness of breath: Secondary | ICD-10-CM | POA: Diagnosis not present

## 2014-01-06 DIAGNOSIS — Z85820 Personal history of malignant melanoma of skin: Secondary | ICD-10-CM | POA: Insufficient documentation

## 2014-01-06 DIAGNOSIS — R918 Other nonspecific abnormal finding of lung field: Secondary | ICD-10-CM

## 2014-01-06 DIAGNOSIS — I129 Hypertensive chronic kidney disease with stage 1 through stage 4 chronic kidney disease, or unspecified chronic kidney disease: Secondary | ICD-10-CM | POA: Insufficient documentation

## 2014-01-06 DIAGNOSIS — K7689 Other specified diseases of liver: Secondary | ICD-10-CM | POA: Diagnosis not present

## 2014-01-06 DIAGNOSIS — G589 Mononeuropathy, unspecified: Secondary | ICD-10-CM | POA: Diagnosis not present

## 2014-01-06 DIAGNOSIS — R0789 Other chest pain: Secondary | ICD-10-CM | POA: Insufficient documentation

## 2014-01-06 HISTORY — PX: MEDIASTINOSCOPY: SHX5086

## 2014-01-06 HISTORY — PX: BRONCHIAL NEEDLE ASPIRATION BIOPSY: SHX5106

## 2014-01-06 HISTORY — PX: VIDEO BRONCHOSCOPY WITH ENDOBRONCHIAL ULTRASOUND: SHX6177

## 2014-01-06 LAB — COMPREHENSIVE METABOLIC PANEL
ALT: 31 U/L (ref 0–53)
AST: 50 U/L — ABNORMAL HIGH (ref 0–37)
Albumin: 3.7 g/dL (ref 3.5–5.2)
Alkaline Phosphatase: 128 U/L — ABNORMAL HIGH (ref 39–117)
BUN: 39 mg/dL — ABNORMAL HIGH (ref 6–23)
CO2: 22 mEq/L (ref 19–32)
Calcium: 10.4 mg/dL (ref 8.4–10.5)
Chloride: 101 mEq/L (ref 96–112)
Creatinine, Ser: 1.31 mg/dL (ref 0.50–1.35)
GFR calc Af Amer: 62 mL/min — ABNORMAL LOW (ref >90)
GFR calc non Af Amer: 54 mL/min — ABNORMAL LOW (ref >90)
Glucose, Bld: 133 mg/dL — ABNORMAL HIGH (ref 70–99)
Potassium: 4.9 mEq/L (ref 3.7–5.3)
Sodium: 140 mEq/L (ref 137–147)
Total Bilirubin: 0.4 mg/dL (ref 0.3–1.2)
Total Protein: 7.2 g/dL (ref 6.0–8.3)

## 2014-01-06 LAB — GLUCOSE, CAPILLARY
Glucose-Capillary: 115 mg/dL — ABNORMAL HIGH (ref 70–99)
Glucose-Capillary: 125 mg/dL — ABNORMAL HIGH (ref 70–99)

## 2014-01-06 LAB — CBC
HCT: 35.8 % — ABNORMAL LOW (ref 39.0–52.0)
Hemoglobin: 13 g/dL (ref 13.0–17.0)
MCH: 31.6 pg (ref 26.0–34.0)
MCHC: 36.3 g/dL — ABNORMAL HIGH (ref 30.0–36.0)
MCV: 87.1 fL (ref 78.0–100.0)
Platelets: 125 10*3/uL — ABNORMAL LOW (ref 150–400)
RBC: 4.11 MIL/uL — ABNORMAL LOW (ref 4.22–5.81)
RDW: 13.1 % (ref 11.5–15.5)
WBC: 11.2 10*3/uL — ABNORMAL HIGH (ref 4.0–10.5)

## 2014-01-06 LAB — TYPE AND SCREEN
ABO/RH(D): O NEG
Antibody Screen: NEGATIVE

## 2014-01-06 LAB — PROTIME-INR
INR: 1.09 (ref 0.00–1.49)
Prothrombin Time: 13.9 seconds (ref 11.6–15.2)

## 2014-01-06 LAB — APTT: aPTT: 34 seconds (ref 24–37)

## 2014-01-06 LAB — ABO/RH: ABO/RH(D): O NEG

## 2014-01-06 SURGERY — BRONCHOSCOPY, WITH EBUS
Anesthesia: General

## 2014-01-06 MED ORDER — EPINEPHRINE HCL 1 MG/ML IJ SOLN
INTRAMUSCULAR | Status: AC
  Filled 2014-01-06: qty 1

## 2014-01-06 MED ORDER — OXYCODONE HCL 5 MG PO TABS: 5.0000 mg | ORAL_TABLET | Freq: Once | ORAL | Status: DC | PRN

## 2014-01-06 MED ORDER — LIDOCAINE HCL (CARDIAC) 20 MG/ML IV SOLN
INTRAVENOUS | Status: AC
  Filled 2014-01-06: qty 5

## 2014-01-06 MED ORDER — CHLORHEXIDINE GLUCONATE 4 % EX LIQD
1.0000 "application " | Freq: Once | CUTANEOUS | Status: DC
  Filled 2014-01-06: qty 15

## 2014-01-06 MED ORDER — FENTANYL CITRATE 0.05 MG/ML IJ SOLN
INTRAMUSCULAR | Status: DC | PRN
  Administered 2014-01-06: 50 ug via INTRAVENOUS

## 2014-01-06 MED ORDER — SUCCINYLCHOLINE CHLORIDE 20 MG/ML IJ SOLN
INTRAMUSCULAR | Status: AC
  Filled 2014-01-06: qty 1

## 2014-01-06 MED ORDER — GLYCOPYRROLATE 0.2 MG/ML IJ SOLN
INTRAMUSCULAR | Status: AC
  Filled 2014-01-06: qty 2

## 2014-01-06 MED ORDER — MIDAZOLAM HCL 2 MG/2ML IJ SOLN
INTRAMUSCULAR | Status: AC
  Filled 2014-01-06: qty 2

## 2014-01-06 MED ORDER — OXYCODONE HCL 5 MG/5ML PO SOLN
5.0000 mg | Freq: Once | ORAL | Status: DC | PRN
Start: 2014-01-06 — End: 2014-01-06

## 2014-01-06 MED ORDER — FENTANYL CITRATE 0.05 MG/ML IJ SOLN: 25.0000 ug | INTRAMUSCULAR | Status: DC | PRN

## 2014-01-06 MED ORDER — ROCURONIUM BROMIDE 50 MG/5ML IV SOLN
INTRAVENOUS | Status: AC
  Filled 2014-01-06: qty 1

## 2014-01-06 MED ORDER — ONDANSETRON HCL 4 MG/2ML IJ SOLN: 4.0000 mg | Freq: Four times a day (QID) | INTRAMUSCULAR | Status: DC | PRN

## 2014-01-06 MED ORDER — MUPIROCIN 2 % EX OINT: TOPICAL_OINTMENT | Freq: Two times a day (BID) | CUTANEOUS | Status: DC

## 2014-01-06 MED ORDER — NEOSTIGMINE METHYLSULFATE 1 MG/ML IJ SOLN
INTRAMUSCULAR | Status: DC | PRN
  Administered 2014-01-06: 3 mg via INTRAVENOUS

## 2014-01-06 MED ORDER — PHENYLEPHRINE 40 MCG/ML (10ML) SYRINGE FOR IV PUSH (FOR BLOOD PRESSURE SUPPORT)
PREFILLED_SYRINGE | INTRAVENOUS | Status: AC
  Filled 2014-01-06: qty 10

## 2014-01-06 MED ORDER — PHENYLEPHRINE HCL 10 MG/ML IJ SOLN
INTRAMUSCULAR | Status: DC | PRN
  Administered 2014-01-06 (×3): 80 ug via INTRAVENOUS

## 2014-01-06 MED ORDER — 0.9 % SODIUM CHLORIDE (POUR BTL) OPTIME
TOPICAL | Status: DC | PRN
  Administered 2014-01-06: 1000 mL

## 2014-01-06 MED ORDER — FENTANYL CITRATE 0.05 MG/ML IJ SOLN
INTRAMUSCULAR | Status: AC
  Filled 2014-01-06: qty 5

## 2014-01-06 MED ORDER — LIDOCAINE HCL (CARDIAC) 20 MG/ML IV SOLN
INTRAVENOUS | Status: DC | PRN
  Administered 2014-01-06: 30 mg via INTRAVENOUS

## 2014-01-06 MED ORDER — VANCOMYCIN HCL IN DEXTROSE 1-5 GM/200ML-% IV SOLN
1000.0000 mg | INTRAVENOUS | Status: AC
  Administered 2014-01-06: 1000 mg via INTRAVENOUS
  Filled 2014-01-06: qty 200

## 2014-01-06 MED ORDER — MIDAZOLAM HCL 5 MG/5ML IJ SOLN
INTRAMUSCULAR | Status: DC | PRN
  Administered 2014-01-06: 2 mg via INTRAVENOUS

## 2014-01-06 MED ORDER — ONDANSETRON HCL 4 MG/2ML IJ SOLN
INTRAMUSCULAR | Status: DC | PRN
  Administered 2014-01-06: 4 mg via INTRAVENOUS

## 2014-01-06 MED ORDER — LACTATED RINGERS IV SOLN
INTRAVENOUS | Status: DC | PRN
  Administered 2014-01-06: 08:00:00 via INTRAVENOUS

## 2014-01-06 MED ORDER — GLYCOPYRROLATE 0.2 MG/ML IJ SOLN
INTRAMUSCULAR | Status: DC | PRN
  Administered 2014-01-06: 0.4 mg via INTRAVENOUS

## 2014-01-06 MED ORDER — NEOSTIGMINE METHYLSULFATE 1 MG/ML IJ SOLN
INTRAMUSCULAR | Status: AC
Start: 2014-01-06 — End: 2014-01-06
  Filled 2014-01-06: qty 10

## 2014-01-06 MED ORDER — PROPOFOL 10 MG/ML IV BOLUS
INTRAVENOUS | Status: DC | PRN
  Administered 2014-01-06 (×2): 50 mg via INTRAVENOUS
  Administered 2014-01-06: 100 mg via INTRAVENOUS

## 2014-01-06 MED ORDER — PROPOFOL 10 MG/ML IV BOLUS
INTRAVENOUS | Status: AC
  Filled 2014-01-06: qty 20

## 2014-01-06 MED ORDER — ROCURONIUM BROMIDE 100 MG/10ML IV SOLN
INTRAVENOUS | Status: DC | PRN
  Administered 2014-01-06: 30 mg via INTRAVENOUS

## 2014-01-06 MED ORDER — LIDOCAINE HCL (PF) 1 % IJ SOLN
INTRAMUSCULAR | Status: AC
  Filled 2014-01-06: qty 30

## 2014-01-06 SURGICAL SUPPLY — 52 items
BLADE SURG 10 STRL SS (BLADE) ×2 IMPLANT
BRUSH CYTOL CELLEBRITY 1.5X140 (MISCELLANEOUS) ×2 IMPLANT
CANISTER SUCTION 2500CC (MISCELLANEOUS) ×4 IMPLANT
CLIP TI MEDIUM 6 (CLIP) IMPLANT
CONT SPEC 4OZ CLIKSEAL STRL BL (MISCELLANEOUS) ×6 IMPLANT
COTTONBALL LRG STERILE PKG (GAUZE/BANDAGES/DRESSINGS) IMPLANT
COVER SURGICAL LIGHT HANDLE (MISCELLANEOUS) ×4 IMPLANT
COVER TABLE BACK 60X90 (DRAPES) ×2 IMPLANT
DERMABOND ADVANCED (GAUZE/BANDAGES/DRESSINGS) ×1
DERMABOND ADVANCED .7 DNX12 (GAUZE/BANDAGES/DRESSINGS) ×1 IMPLANT
DRAPE LAPAROTOMY T 102X78X121 (DRAPES) ×2 IMPLANT
DRSG AQUACEL AG ADV 3.5X14 (GAUZE/BANDAGES/DRESSINGS) ×2 IMPLANT
ELECT CAUTERY BLADE 6.4 (BLADE) ×2 IMPLANT
ELECT REM PT RETURN 9FT ADLT (ELECTROSURGICAL) ×2
ELECTRODE REM PT RTRN 9FT ADLT (ELECTROSURGICAL) ×1 IMPLANT
FORCEPS BIOP RJ4 1.8 (CUTTING FORCEPS) ×2 IMPLANT
GAUZE SPONGE 4X4 16PLY XRAY LF (GAUZE/BANDAGES/DRESSINGS) ×2 IMPLANT
GLOVE BIO SURGEON STRL SZ 6.5 (GLOVE) ×4 IMPLANT
GLOVE BIOGEL PI IND STRL 7.0 (GLOVE) ×2 IMPLANT
GLOVE BIOGEL PI INDICATOR 7.0 (GLOVE) ×2
GOWN STRL REUS W/ TWL LRG LVL3 (GOWN DISPOSABLE) ×3 IMPLANT
GOWN STRL REUS W/TWL LRG LVL3 (GOWN DISPOSABLE) ×3
HEMOSTAT SURGICEL 2X14 (HEMOSTASIS) IMPLANT
KIT BASIN OR (CUSTOM PROCEDURE TRAY) ×2 IMPLANT
KIT ROOM TURNOVER OR (KITS) ×4 IMPLANT
MARKER SKIN DUAL TIP RULER LAB (MISCELLANEOUS) ×2 IMPLANT
NEEDLE 22X1 1/2 (OR ONLY) (NEEDLE) IMPLANT
NEEDLE BIOPSY TRANSBRONCH 21G (NEEDLE) IMPLANT
NEEDLE BLUNT 18X1 FOR OR ONLY (NEEDLE) IMPLANT
NEEDLE SYS SONOTIP II EBUSTBNA (NEEDLE) ×4 IMPLANT
NS IRRIG 1000ML POUR BTL (IV SOLUTION) ×4 IMPLANT
OIL SILICONE PENTAX (PARTS (SERVICE/REPAIRS)) ×2 IMPLANT
PACK SURGICAL SETUP 50X90 (CUSTOM PROCEDURE TRAY) ×2 IMPLANT
PAD ARMBOARD 7.5X6 YLW CONV (MISCELLANEOUS) ×8 IMPLANT
PENCIL BUTTON HOLSTER BLD 10FT (ELECTRODE) ×2 IMPLANT
SPONGE GAUZE 4X4 12PLY (GAUZE/BANDAGES/DRESSINGS) ×2 IMPLANT
SPONGE INTESTINAL PEANUT (DISPOSABLE) IMPLANT
STAPLER VISISTAT 35W (STAPLE) IMPLANT
SUT VIC AB 3-0 SH 18 (SUTURE) ×2 IMPLANT
SUT VICRYL 4-0 PS2 18IN ABS (SUTURE) ×2 IMPLANT
SWAB COLLECTION DEVICE MRSA (MISCELLANEOUS) IMPLANT
SYR 20CC LL (SYRINGE) ×2 IMPLANT
SYR 20ML ECCENTRIC (SYRINGE) ×4 IMPLANT
SYR 5ML LUER SLIP (SYRINGE) ×2 IMPLANT
SYR CONTROL 10ML LL (SYRINGE) IMPLANT
SYRINGE 10CC LL (SYRINGE) ×2 IMPLANT
TOWEL OR 17X24 6PK STRL BLUE (TOWEL DISPOSABLE) ×4 IMPLANT
TOWEL OR 17X26 10 PK STRL BLUE (TOWEL DISPOSABLE) ×2 IMPLANT
TRAP SPECIMEN MUCOUS 40CC (MISCELLANEOUS) ×4 IMPLANT
TUBE ANAEROBIC SPECIMEN COL (MISCELLANEOUS) IMPLANT
TUBE CONNECTING 12X1/4 (SUCTIONS) ×4 IMPLANT
WATER STERILE IRR 1000ML POUR (IV SOLUTION) ×2 IMPLANT

## 2014-01-06 NOTE — Anesthesia Procedure Notes (Signed)
Procedure Name: Intubation Date/Time: 01/06/2014 8:44 AM Performed by: Manuela Schwartz B Pre-anesthesia Checklist: Patient identified, Emergency Drugs available, Suction available, Patient being monitored and Timeout performed Patient Re-evaluated:Patient Re-evaluated prior to inductionOxygen Delivery Method: Circle system utilized Preoxygenation: Pre-oxygenation with 100% oxygen Intubation Type: IV induction and Rapid sequence Laryngoscope Size: Mac and 3 Grade View: Grade I Tube type: Oral Tube size: 8.5 mm Number of attempts: 1 Airway Equipment and Method: Stylet Placement Confirmation: ETT inserted through vocal cords under direct vision,  positive ETCO2 and breath sounds checked- equal and bilateral Secured at: 22 cm Tube secured with: Tape Dental Injury: Teeth and Oropharynx as per pre-operative assessment

## 2014-01-06 NOTE — Anesthesia Postprocedure Evaluation (Signed)
Anesthesia Post Note  Patient: Andrew Church  Procedure(s) Performed: Procedure(s) (LRB): VIDEO BRONCHOSCOPY WITH ENDOBRONCHIAL ULTRASOUND (N/A) NEEDLE BIOPSY (N/A) MEDIASTINOSCOPY (N/A)  Anesthesia type: General  Patient location: PACU  Post pain: Pain level controlled and Adequate analgesia  Post assessment: Post-op Vital signs reviewed, Patient's Cardiovascular Status Stable, Respiratory Function Stable, Patent Airway and Pain level controlled  Last Vitals:  Filed Vitals:   01/06/14 1100  BP:   Pulse: 79  Temp:   Resp: 16    Post vital signs: Reviewed and stable  Level of consciousness: awake, alert  and oriented  Complications: No apparent anesthesia complications

## 2014-01-06 NOTE — Discharge Instructions (Signed)
Flexible Bronchoscopy, Care After °Refer to this sheet in the next few weeks. These instructions provide you with information on caring for yourself after your procedure. Your health care provider may also give you more specific instructions. Your treatment has been planned according to current medical practices, but problems sometimes occur. Call your health care provider if you have any problems or questions after your procedure.  °WHAT TO EXPECT AFTER THE PROCEDURE °It is normal to have the following symptoms for 24 48 hours after the procedure:  °· Increased cough. °· Low-grade fever. °· Sore throat or hoarse voice. °· Small streaks of blood in your thick spit (sputum), if tissue samples were taken (biopsy). °HOME CARE INSTRUCTIONS  °· Do not eat or drink anything for 2 hours after your procedure. Your nose and throat were numbed by medicine. If you try to eat or drink before the medicine wears off, food or drink could go into your lungs or you could burn yourself. After the numbness is gone and your cough and gag reflexes have returned, you may eat soft food and drink liquids slowly.   °· The day after the procedure, you can go back to your normal diet.   °· You may resume normal activities.   °· Follow up with your health care provider as directed. It is important to keep all follow-up appointments, especially if tissue samples were taken for testing (biopsy). °SEEK IMMEDIATE MEDICAL CARE IF:  °· You have increasing shortness of breath.   °· You become lightheaded or faint.   °· You have chest pain.   °· You have any new concerning symptoms. °· You cough up more than a small amount of blood. °· The amount of blood you cough up increases. °MAKE SURE YOU: °· Understand these instructions. °· Will watch your condition. °· Will get help right away if you are not doing well or get worse. °Document Released: 05/11/2005 Document Revised: 08/12/2013 Document Reviewed: 06/26/2013 °[employer]® Patient Information ©2014  [employer]. ° °

## 2014-01-06 NOTE — H&P (Signed)
[address]       [place] [address]             [phone]                           Erron E Mandella [place] Medical Record #[id number] Date of Birth: 1946-08-03  Referring: Gaynelle Arabian, MD Primary Care: Simona Huh, MD  Chief Complaint:    Chief Complaint  Patient presents with  . Lung Mass    Surgical eval on left lower lung mass, Chest CT 12/31/13    History of Present Illness:    Andrew Church 68 y.o. male is seen in the office  today for left lung mass. Patient is life long non smoker who presents with increasing chest discomfort and SOB of several month duration but getting worse last 2 weeks. He had upper respiratory symptoms  With cough but no hemoptyses.  Several months ago he was elevated by cardiology for chest discomfort and hypertension, a  with Myoview stress test was done. Chest xray was done leading to CT of the chest. Patient referred for lung mass.   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   Zubrod Score: At the time of surgery this patient's most appropriate activity status/level should be described as: []     0    Normal activity, no symptoms [x]     1    Restricted in physical strenuous activity but ambulatory, able to do out light work []     2    Ambulatory and capable of self care, unable to do work activities, up and about               >50 % of waking hours                              []     3    Only limited self care, in bed greater than 50% of waking hours []     4    Completely disabled, no self care, confined to bed or chair []     5    Moribund   Past Medical History  Diagnosis Date  . Neuropathy   . Prediabetes   . HTN (hypertension)   . Hyperlipemia   . Melanoma     04/2003  . Hx of cardiovascular stress test     a.   Lexiscan Myoview (11/2013): EF 62%, no ischemia; normal study.  . Fatty liver   . Diabetes mellitus     10/2004  . ED (erectile dysfunction)   . Hepatitis B carrier     per [facility] 2013  . CKD (chronic kidney disease)     stage 3    Past Surgical History  Procedure Laterality Date  . Foot surgery    . Right ankle fracture      12/2005  . Lasik    . Hydrocele excision    . Melanoma excision      Family History  Problem Relation Age of Onset  . Heart attack Mother   . Emphysema and lung cancer  Age 77 Father     History   Social History  . Marital Status: Married    Spouse  Name: N/A    Number of Children: N/A  . Years of Education: N/A   Occupational History  . Works full time at [employer]  . Smoking status: Never Smoker   . Smokeless tobacco: Never Used  . Alcohol Use: No  . Drug Use: No  . Sexual Activity: No   Other Topics Concern  . Not on file   Social History Narrative  . No narrative on file    History  Smoking status  . Never Smoker   Smokeless tobacco  . Never Used    History  Alcohol Use No     Allergies  Allergen Reactions  . Penicillins Rash    Current Facility-Administered Medications  Medication Dose Route Frequency Provider Last Rate Last Dose  . chlorhexidine (HIBICLENS) 4 % liquid 1 application  1 application Topical Once Grace Isaac, MD      . vancomycin (VANCOCIN) IVPB 1000 mg/200 mL premix  1,000 mg Intravenous On Call to OR Grace Isaac, MD       Facility-Administered Medications Ordered in Other Encounters  Medication Dose Route Frequency Provider Last Rate Last Dose  . lactated ringers infusion    Continuous PRN Babs Bertin, CRNA         Review of Systems:     Cardiac Review of Systems: Y or N  Chest Pain [  y  ]  Resting SOB [  y ] Exertional SOB  [ y ]  Orthopnea [ n ]   Pedal Edema [ n  ]    Palpitations [n  ] Syncope  [ n ]   Presyncope [ n  ]  General Review of Systems: [Y] =  yes [  ]=no Constitional: recent weight change Blue.Reese  ];  Wt loss over the last 3 months [ 9 lbs then gained some back  ] anorexia [  ]; fatigue [  ]; nausea [  ]; night sweats [  ]; fever [  ]; or chills [  ];          Dental: poor dentition[  ]; Last Dentist visit:   Eye : blurred vision [  ]; diplopia [   ]; vision changes [  ];  Amaurosis fugax[  ]; Resp: cough [ y ];  wheezing[y  ];  hemoptysis[ n ]; shortness of breath[ y ]; paroxysmal nocturnal dyspnea[ y ]; dyspnea on exertion[y  ]; or orthopnea[  ];  GI:  gallstones[  ], vomiting[  ];  dysphagia[  ]; melena[  ];  hematochezia [  ]; heartburn[  ];   Hx of  Colonoscopy[ y ]; GU: kidney stones [  ]; hematuria[  ];   dysuria [  ];  nocturia[  ];  history of     obstruction [  ]; urinary frequency [  ]             Skin: rash, swelling[  ];, hair loss[  ];  peripheral edema[  ];  or itching[  ]; Musculosketetal: myalgias[  ];  joint swelling[  ];  joint erythema[  ];  joint pain[  ];  back pain[  ];  Heme/Lymph: bruising[  ];  bleeding[  ];  anemia[  ];  Neuro: TIA[  ];  headaches[  ];  stroke[  ];  vertigo[  ];  seizures[  ];   Paresthesias[neuropath in feet severe  ];  difficulty walking[  ];  Psych:depression[  ]; anxiety[  ];  Endocrine: diabetes[n  ];  thyroid dysfunction[  ];  Immunizations: Flu up to date [ y ]; Pneumococcal up to date Blue.Reese  ];  Other:  Physical Exam: BP 158/68  Pulse 89  Temp(Src) 98.4 F (36.9 C) (Oral)  Resp 18  SpO2 93%  PHYSICAL EXAMINATION:  General appearance: alert, cooperative, appears stated age and mild distress Neurologic: intact Heart: regular rate and rhythm, S1, S2 normal, no murmur, click, rub or gallop Lungs: clear to auscultation bilaterally Abdomen: soft, non-tender; bowel sounds normal; no masses,  no organomegaly Extremities: extremities normal, atraumatic, no cyanosis or edema and Homans sign is negative, no sign of DVT Rt groin clear of palpable masses, no other palpable adenopathy, rt  supraclavicular fossa full but no palpable mass  Diagnostic Studies & Laboratory data:     Recent Radiology Findings:  Chest 2 View  01/06/2014   CLINICAL DATA:  Lung mass.  Preop.  EXAM: CHEST  2 VIEW  COMPARISON:  12/29/2013  FINDINGS: Unchanged appearance of left infrahilar mass with bilateral mediastinal lymphadenopathy. There are small bilateral pleural effusions, also seen previously. No edema or consolidation. Normal heart size. No acute osseous findings.  IMPRESSION: 1. Likely malignant left lung mass and lymphadenopathy. 2. Trace bilateral pleural effusions, unchanged from 12/29/2013.   Electronically Signed   By: Jorje Guild M.D.   On: 01/06/2014 07:16    Dg Chest 2 View  12/29/2013   CLINICAL DATA:  Cough, congestion, shortness of breath for 2 weeks, evaluate for pneumonia  EXAM: CHEST  2 VIEW  COMPARISON:  DG CHEST 1V PORT dated 09/03/2013; DG CHEST 2 VIEW dated 06/05/2012; DG CHEST 2 VIEW dated 06/07/2011  FINDINGS: Grossly unchanged borderline enlarged cardiac silhouette. Interval development of an approximately 4.9 x 4.2 cm apparent mass within the superior segment of the left lower lobe, best appreciated on the provided lateral radiograph. This finding is associated with apparent nodular thickening of the contralateral right paratracheal stripe. The lungs remain hyperexpanded with flattening of bilateral hemidiaphragms and mild diffuse slightly nodular thickening of the pulmonary interstitium. Interval development of trace bilateral pleural effusions. Worsening left basilar heterogeneous/consolidative opacities. No evidence of edema. No pneumothorax. Grossly unchanged bones.  IMPRESSION: 1. Interval development of an approximately 4.9 cm mass within the superior segment left lower lobe with possible adenopathy within in the contralateral right mediastinum. Further evaluation with contrast-enhanced chest CT is recommended. 2. Worsening left basilar/retrocardiac opacities, atelectasis versus  infiltrate. 3. Interval development of trace bilateral pleural effusions. No evidence of edema. These results will be called to the ordering clinician or representative by the Radiologist Assistant, and communication documented in the PACS Dashboard.   Electronically Signed   By: Sandi Mariscal M.D.   On: 12/29/2013 16:56   Ct Chest W Contrast  12/31/2013   CLINICAL DATA:  Abnormal chest radiograph with possible adenopathy, chest pain, shortness of breath.  EXAM: CT CHEST WITH CONTRAST  TECHNIQUE: Multidetector CT imaging of the chest was performed during intravenous contrast administration.  CONTRAST:  43mL OMNIPAQUE IOHEXOL 300 MG/ML  SOLN  COMPARISON:  Chest radiographs dated 12/29/2013.  FINDINGS: 5.0 x 4.4 cm central left lower lobe mass (series 3/image 39), suspicious for primary bronchogenic neoplasm. Mass extends into the left hilar region (series 2/ image 34). Narrowing of the left lower lobe bronchus.  Small right and trace left pleural effusions.  No pneumothorax.  Visualized thyroid is unremarkable.  The heart is normal in size. No pericardial effusion. Mild coronary atherosclerosis in the LAD. No  evidence of thoracic aortic aneurysm.  Associated mediastinal lymphadenopathy, including:  --8 mm short axis right supraclavicular node (series 2/image 9)  --20 mm short axis high right paratracheal node (series 2/image 21)  --13 mm short axis right hilar node (series 2/image 32)  --31 mm short axis subcarinal node (series 2/image 34)  Visualized upper abdomen is unremarkable. Bilateral adrenal glands are within normal limits.  Degenerative changes of the visualized thoracolumbar spine. Probable bone island at T12 (sagittal image 79).  IMPRESSION: 5.0 cm central left lower lobe mass, suspicious for primary bronchogenic neoplasm.  Associated thoracic lymphadenopathy, as described above, including a prominent 8 mm short axis right supraclavicular node.  Small right and trace left pleural effusions.  For tissue  diagnosis, consider bronchoscopy or percutaneous sampling of the right supraclavicular node.  These results will be called to the ordering clinician or representative by the Radiologist Assistant, and communication documented in the PACS Dashboard.   Electronically Signed   By: Julian Hy M.D.   On: 12/31/2013 14:36      Recent Lab Findings: Lab Results  Component Value Date   WBC 11.2* 01/06/2014   HGB 13.0 01/06/2014   HCT 35.8* 01/06/2014   PLT 125* 01/06/2014   GLUCOSE 133* 01/06/2014   ALT 31 01/06/2014   AST 50* 01/06/2014   NA 140 01/06/2014   K 4.9 01/06/2014   CL 101 01/06/2014   CREATININE 1.31 01/06/2014   BUN 39* 01/06/2014   CO2 22 01/06/2014   INR 1.09 01/06/2014   PATH:Date Received: 05/04/2003  FINAL DIAGNOSIS  MICROSCOPIC EXAMINATION AND DIAGNOSIS  RIGHT GROIN SKIN AND SENTINEL LYMPH NODES, WIDE EXCISION: MALIGNANT MELANOMA. SEE TABLE. THREE SENTINEL LYMPH NODES: MELANOCYTES WITHIN LYMPH NODE CAPSULE. SEE COMMENT.  MELANOMA TABLE- Type: Superficial spreading Clark' s Level: IV Breslow' s measurement: 2.5 mm Host response: Slight Mitosis: Not numerous Regression: Not seen Ulceration: Not seen Satellitosis: Not seen Vascular Invasion: See comment Margin: Free of tumor Lymph nodes: # examined 3; # positive 0 TNM: pT3a, pN0, pMX Comment: At the base of the tumor within the dermis there are several nests of tumor with clear spaces around the nests consistent with tissue shrinkage artifact rather than vascular space involvement by tumor.  Within the capsule of one of the sentinel lymph nodes (Slides C and D) there are nests of melanocytic cells with minimal cytologic atypia. These cells stain positive with immunoperoxidase for Melan-A but are negative for HMB45 and S100. These cells have significantly less cytologic atypia than the melanoma and the features favor nevus cells within the lymph node capsule. The remaining two nodes show no metastatic melanoma with  H immunoperoxidase. All the immunoperoxidase controls stained appropriately. Case discussed with Dr. Rebekah Chesterfield on 05/07/03. (JDP:caf 05/07/03)  cf Date Reported: 05/07/2003 Chrystie Nose. Saralyn Pilar, MD  Electronically Signed Out By JDP    Assessment / Plan:   Left lung mass with extensive mediastinal adenopathy, with symptomatic sob. I have recommended to patient we proceed with BX . PET scan and MRI of the brain are needed. Will precede with Bronch, EBUS poss mediastinoscopy  to obtain tissue dx. Clinicial Stage 3B lung cancer , but poss mets from previous melanoma.   The goals risks and alternatives of the planned surgical procedure Bronch, EBUS poss mediastinoscopy have been discussed with the patient in detail. The risks of the procedure including death, infection, stroke, myocardial infarction, bleeding, blood transfusion have all been discussed specifically.  I have quoted Andrew Church a 1% of perioperative  mortality and a complication rate as high as 10 %. The patient's questions have been answered.Andrew Church is willing  to proceed with the planned procedure.   Grace Isaac MD      [address] [place] [address] Office [phone]   Beeper [id number]  01/06/2014 8:29 AM

## 2014-01-06 NOTE — Transfer of Care (Signed)
Immediate Anesthesia Transfer of Care Note  Patient: Andrew Church  Procedure(s) Performed: Procedure(s): VIDEO BRONCHOSCOPY WITH ENDOBRONCHIAL ULTRASOUND (N/A) NEEDLE BIOPSY (N/A) MEDIASTINOSCOPY (N/A)  Patient Location: PACU  Anesthesia Type:General  Level of Consciousness: alert  and patient cooperative  Airway & Oxygen Therapy: Patient Spontanous Breathing and Patient connected to face mask oxygen  Post-op Assessment: Report given to PACU RN and Post -op Vital signs reviewed and stable  Post vital signs: Reviewed and stable  Complications: No apparent anesthesia complications

## 2014-01-06 NOTE — Brief Op Note (Signed)
      [address]       [place] [address]             [phone]      01/06/2014  10:39 AM  PATIENT:  Kirstie Peri Baldassari  68 y.o. male  PRE-OPERATIVE DIAGNOSIS:  LLL MASS  POST-OPERATIVE DIAGNOSIS:  LLL MASS  PROCEDURE:  Procedure(s): VIDEO BRONCHOSCOPY WITH ENDOBRONCHIAL ULTRASOUND (N/A) NEEDLE BIOPSY (N/A) Bx of Left lower lung lesion  SURGEON:  Surgeon(s) and Role:    * Grace Isaac, MD - Primary   ANESTHESIA:   general  EBL:  Total I/O In: 700 [I.V.:700] Out: -   BLOOD ADMINISTERED:none  DRAINS: none   LOCAL MEDICATIONS USED:  NONE  SPECIMEN:  Source of Specimen:  #7 nodes and left lower lung lesion  DISPOSITION OF SPECIMEN:  PATHOLOGY  COUNTS:  YES   DICTATION: .Dragon Dictation  PLAN OF CARE: Discharge to home after PACU  PATIENT DISPOSITION:  PACU - hemodynamically stable.   Delay start of Pharmacological VTE agent (>24hrs) due to surgical blood loss or risk of bleeding: yes

## 2014-01-06 NOTE — Progress Notes (Deleted)
[address]       [place] [address]             [phone]                           Andrew Church Medical Record [id number] Date of Birth: 06/01/1946  Referring: Gaynelle Arabian, MD Primary Care: Simona Huh, MD  Chief Complaint:    Chief Complaint  Patient presents with  . Lung Mass    Surgical eval on left lower lung mass, Chest CT 12/31/13    History of Present Illness:    Andrew Church 68 y.o. male is seen in the office  today for left lung mass. Patient is life long non smoker who presents with increasing chest discomfort and SOB of several month duration but getting worse last 2 weeks. He had upper respiratory symptoms  With cough but no hemoptyses.  Several months ago he was elevated by cardiology for chest discomfort and hypertension, a  with Myoview stress test was done. Chest xray was done leading to CT of the chest. Patient referred for lung mass.   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   Zubrod Score: At the time of surgery this patient's most appropriate activity status/level should be described as: []     0    Normal activity, no symptoms [x]     1    Restricted in physical strenuous activity but ambulatory, able to do out light work []     2    Ambulatory and capable of self care, unable to do work activities, up and about               >50 % of waking hours                              []     3    Only limited self care, in bed greater than 50% of waking hours []     4    Completely disabled, no self care, confined to bed or chair []     5    Moribund   Past Medical History  Diagnosis Date  . Neuropathy   . Prediabetes   . HTN (hypertension)   . Hyperlipemia   . Melanoma     04/2003  . Hx of cardiovascular stress test     a.   Lexiscan Myoview (11/2013): EF 62%, no ischemia; normal study.  . Fatty liver   . Diabetes mellitus     10/2004  . ED (erectile dysfunction)   . Hepatitis B carrier     per [facility] 2013  . CKD (chronic kidney disease)     stage 3    Past Surgical History  Procedure Laterality Date  . Foot surgery    . Right ankle fracture      12/2005  . Lasik    . Hydrocele excision    . Melanoma excision      Family History  Problem Relation Age of Onset  . Heart attack Mother   . Emphysema and lung cancer  Age 71 Father     History   Social History  . Marital Status: Married    Spouse  Name: N/A    Number of Children: N/A  . Years of Education: N/A   Occupational History  . Works full time at [employer]  . Smoking status: Never Smoker   . Smokeless tobacco: Never Used  . Alcohol Use: No  . Drug Use: No  . Sexual Activity: No   Other Topics Concern  . Not on file   Social History Narrative  . No narrative on file    History  Smoking status  . Never Smoker   Smokeless tobacco  . Never Used    History  Alcohol Use No     Allergies  Allergen Reactions  . Penicillins Rash    Current Facility-Administered Medications  Medication Dose Route Frequency Provider Last Rate Last Dose  . chlorhexidine (HIBICLENS) 4 % liquid 1 application  1 application Topical Once Grace Isaac, MD      . vancomycin (VANCOCIN) IVPB 1000 mg/200 mL premix  1,000 mg Intravenous On Call to OR Grace Isaac, MD       Facility-Administered Medications Ordered in Other Encounters  Medication Dose Route Frequency Provider Last Rate Last Dose  . lactated ringers infusion    Continuous PRN Babs Bertin, CRNA         Review of Systems:     Cardiac Review of Systems: Y or N  Chest Pain [  y  ]  Resting SOB [  y ] Exertional SOB  [ y ]  Orthopnea [ n ]   Pedal Edema [ n  ]    Palpitations [n  ] Syncope  [ n ]   Presyncope [ n  ]  General Review of Systems: [Y] =  yes [  ]=no Constitional: recent weight change Blue.Reese  ];  Wt loss over the last 3 months [ 9 lbs then gained some back  ] anorexia [  ]; fatigue [  ]; nausea [  ]; night sweats [  ]; fever [  ]; or chills [  ];          Dental: poor dentition[  ]; Last Dentist visit:   Eye : blurred vision [  ]; diplopia [   ]; vision changes [  ];  Amaurosis fugax[  ]; Resp: cough [ y ];  wheezing[y  ];  hemoptysis[ n ]; shortness of breath[ y ]; paroxysmal nocturnal dyspnea[ y ]; dyspnea on exertion[y  ]; or orthopnea[  ];  GI:  gallstones[  ], vomiting[  ];  dysphagia[  ]; melena[  ];  hematochezia [  ]; heartburn[  ];   Hx of  Colonoscopy[ y ]; GU: kidney stones [  ]; hematuria[  ];   dysuria [  ];  nocturia[  ];  history of     obstruction [  ]; urinary frequency [  ]             Skin: rash, swelling[  ];, hair loss[  ];  peripheral edema[  ];  or itching[  ]; Musculosketetal: myalgias[  ];  joint swelling[  ];  joint erythema[  ];  joint pain[  ];  back pain[  ];  Heme/Lymph: bruising[  ];  bleeding[  ];  anemia[  ];  Neuro: TIA[  ];  headaches[  ];  stroke[  ];  vertigo[  ];  seizures[  ];   Paresthesias[neuropath in feet severe  ];  difficulty walking[  ];  Psych:depression[  ]; anxiety[  ];  Endocrine: diabetes[n  ];  thyroid dysfunction[  ];  Immunizations: Flu up to date [ y ]; Pneumococcal up to date Blue.Reese  ];  Other:  Physical Exam: BP 158/68  Pulse 89  Temp(Src) 98.4 F (36.9 C) (Oral)  Resp 18  SpO2 93%  PHYSICAL EXAMINATION:  General appearance: alert, cooperative, appears stated age and mild distress Neurologic: intact Heart: regular rate and rhythm, S1, S2 normal, no murmur, click, rub or gallop Lungs: clear to auscultation bilaterally Abdomen: soft, non-tender; bowel sounds normal; no masses,  no organomegaly Extremities: extremities normal, atraumatic, no cyanosis or edema and Homans sign is negative, no sign of DVT Rt groin clear of palpable masses, no other palpable adenopathy, rt  supraclavicular fossa full but no palpable mass  Diagnostic Studies & Laboratory data:     Recent Radiology Findings:   Dg Chest 2 View  12/29/2013   CLINICAL DATA:  Cough, congestion, shortness of breath for 2 weeks, evaluate for pneumonia  EXAM: CHEST  2 VIEW  COMPARISON:  DG CHEST 1V PORT dated 09/03/2013; DG CHEST 2 VIEW dated 06/05/2012; DG CHEST 2 VIEW dated 06/07/2011  FINDINGS: Grossly unchanged borderline enlarged cardiac silhouette. Interval development of an approximately 4.9 x 4.2 cm apparent mass within the superior segment of the left lower lobe, best appreciated on the provided lateral radiograph. This finding is associated with apparent nodular thickening of the contralateral right paratracheal stripe. The lungs remain hyperexpanded with flattening of bilateral hemidiaphragms and mild diffuse slightly nodular thickening of the pulmonary interstitium. Interval development of trace bilateral pleural effusions. Worsening left basilar heterogeneous/consolidative opacities. No evidence of edema. No pneumothorax. Grossly unchanged bones.  IMPRESSION: 1. Interval development of an approximately 4.9 cm mass within the superior segment left lower lobe with possible adenopathy within in the contralateral right mediastinum. Further evaluation with contrast-enhanced chest CT is recommended. 2. Worsening left basilar/retrocardiac opacities, atelectasis versus infiltrate. 3. Interval development of trace bilateral pleural effusions. No evidence of edema. These results will be called to the ordering clinician or representative by the Radiologist Assistant, and communication documented in the PACS Dashboard.   Electronically Signed   By: Sandi Mariscal M.D.   On: 12/29/2013 16:56   Ct Chest W Contrast  12/31/2013   CLINICAL DATA:  Abnormal chest radiograph with possible adenopathy, chest pain, shortness of breath.  EXAM: CT CHEST WITH CONTRAST  TECHNIQUE: Multidetector CT imaging of the chest was performed during  intravenous contrast administration.  CONTRAST:  28mL OMNIPAQUE IOHEXOL 300 MG/ML  SOLN  COMPARISON:  Chest radiographs dated 12/29/2013.  FINDINGS: 5.0 x 4.4 cm central left lower lobe mass (series 3/image 39), suspicious for primary bronchogenic neoplasm. Mass extends into the left hilar region (series 2/ image 34). Narrowing of the left lower lobe bronchus.  Small right and trace left pleural effusions.  No pneumothorax.  Visualized thyroid is unremarkable.  The heart is normal in size. No pericardial effusion. Mild coronary atherosclerosis in the LAD. No evidence of thoracic aortic aneurysm.  Associated mediastinal lymphadenopathy, including:  --8 mm short axis right supraclavicular node (series 2/image 9)  --20 mm short axis high right paratracheal node (series 2/image 21)  --13 mm short axis right hilar node (series 2/image 32)  --31 mm short axis subcarinal node (series 2/image 34)  Visualized upper abdomen is unremarkable. Bilateral adrenal glands are within normal limits.  Degenerative changes of the visualized thoracolumbar spine. Probable bone island at T12 (sagittal image 79).  IMPRESSION: 5.0 cm central left lower lobe mass,  suspicious for primary bronchogenic neoplasm.  Associated thoracic lymphadenopathy, as described above, including a prominent 8 mm short axis right supraclavicular node.  Small right and trace left pleural effusions.  For tissue diagnosis, consider bronchoscopy or percutaneous sampling of the right supraclavicular node.  These results will be called to the ordering clinician or representative by the Radiologist Assistant, and communication documented in the PACS Dashboard.   Electronically Signed   By: Julian Hy M.D.   On: 12/31/2013 14:36      Recent Lab Findings: Lab Results  Component Value Date   WBC 11.2* 01/06/2014   HGB 13.0 01/06/2014   HCT 35.8* 01/06/2014   PLT 125* 01/06/2014   GLUCOSE 133* 01/06/2014   ALT 31 01/06/2014   AST 50* 01/06/2014   NA 140 01/06/2014   K  4.9 01/06/2014   CL 101 01/06/2014   CREATININE 1.31 01/06/2014   BUN 39* 01/06/2014   CO2 22 01/06/2014   INR 1.09 01/06/2014   PATH:Date Received: 05/04/2003  FINAL DIAGNOSIS  MICROSCOPIC EXAMINATION AND DIAGNOSIS  RIGHT GROIN SKIN AND SENTINEL LYMPH NODES, WIDE EXCISION: MALIGNANT MELANOMA. SEE TABLE. THREE SENTINEL LYMPH NODES: MELANOCYTES WITHIN LYMPH NODE CAPSULE. SEE COMMENT.  MELANOMA TABLE- Type: Superficial spreading Clark' s Level: IV Breslow' s measurement: 2.5 mm Host response: Slight Mitosis: Not numerous Regression: Not seen Ulceration: Not seen Satellitosis: Not seen Vascular Invasion: See comment Margin: Free of tumor Lymph nodes: # examined 3; # positive 0 TNM: pT3a, pN0, pMX Comment: At the base of the tumor within the dermis there are several nests of tumor with clear spaces around the nests consistent with tissue shrinkage artifact rather than vascular space involvement by tumor.  Within the capsule of one of the sentinel lymph nodes (Slides C and D) there are nests of melanocytic cells with minimal cytologic atypia. These cells stain positive with immunoperoxidase for Melan-A but are negative for HMB45 and S100. These cells have significantly less cytologic atypia than the melanoma and the features favor nevus cells within the lymph node capsule. The remaining two nodes show no metastatic melanoma with H immunoperoxidase. All the immunoperoxidase controls stained appropriately. Case discussed with Dr. Rebekah Chesterfield on 05/07/03. (JDP:caf 05/07/03)  cf Date Reported: 05/07/2003 Chrystie Nose. Saralyn Pilar, MD  Electronically Signed Out By JDP    Assessment / Plan:   Left lung mass with extensive mediastinal adenopathy, with symptomatic sob. I have recommended to patient we proceed with BX soon. PET scan and MRI of the brain are needed. Will precede with Bronch, EBUS poss mediastinoscopy tomorrow to obtain tissue dx. Clinicial Stage 3B lung cancer , but poss mets from previous  melanoma.       I spent 55 minutes counseling the patient face to face. The total time spent in the appointment was 80 minutes.  Grace Isaac MD      [address] [place], [address] Office [phone]   Beeper [phone]  01/06/2014 8:28 AM

## 2014-01-06 NOTE — Anesthesia Preprocedure Evaluation (Signed)
Anesthesia Evaluation  Patient identified by MRN, date of birth, ID band Patient awake    Reviewed: Allergy & Precautions, H&P , NPO status , Patient's Chart, lab work & pertinent test results  Airway Mallampati: II  Neck ROM: full    Dental   Pulmonary  LLL mass         Cardiovascular hypertension,  a.  Lexiscan Myoview (11/2013): EF 62%, no ischemia; normal study.   Neuro/Psych    GI/Hepatic (+) Hepatitis -, B  Endo/Other  diabetes, Type 2  Renal/GU Renal InsufficiencyRenal disease     Musculoskeletal   Abdominal   Peds  Hematology   Anesthesia Other Findings   Reproductive/Obstetrics                           Anesthesia Physical Anesthesia Plan  ASA: II  Anesthesia Plan: General   Post-op Pain Management:    Induction: Intravenous  Airway Management Planned: Oral ETT  Additional Equipment:   Intra-op Plan:   Post-operative Plan: Extubation in OR  Informed Consent: I have reviewed the patients History and Physical, chart, labs and discussed the procedure including the risks, benefits and alternatives for the proposed anesthesia with the patient or authorized representative who has indicated his/her understanding and acceptance.     Plan Discussed with: CRNA, Anesthesiologist and Surgeon  Anesthesia Plan Comments:         Anesthesia Quick Evaluation

## 2014-01-06 NOTE — Progress Notes (Signed)
01/06/14 0725  OBSTRUCTIVE SLEEP APNEA  Have you ever been diagnosed with sleep apnea through a sleep study? No  Do you snore loudly (loud enough to be heard through closed doors)?  0  Do you often feel tired, fatigued, or sleepy during the daytime? 1  Has anyone observed you stop breathing during your sleep? 0  Do you have, or are you being treated for high blood pressure? 1  BMI more than 35 kg/m2? 0  Age over 70 years old? 1  Neck circumference greater than 40 cm/18 inches? 0  Gender: 1  Obstructive Sleep Apnea Score 4  Score 4 or greater  Results sent to PCP

## 2014-01-06 NOTE — Progress Notes (Signed)
MD Dr. Marcie Bal ok to discharge patient with occasional desaturation episodes to the 89% range, as long as the patient is not drowsy and will return to a good baseline with cough and deep breathe.

## 2014-01-07 ENCOUNTER — Encounter (HOSPITAL_COMMUNITY): Payer: Self-pay | Admitting: Cardiothoracic Surgery

## 2014-01-07 ENCOUNTER — Telehealth: Payer: Self-pay | Admitting: *Deleted

## 2014-01-07 NOTE — Telephone Encounter (Signed)
Called pt to schedule April appointment with Dr. Meda Coffee. Pt is aware & appt scheduled. Reminder mailed to pt  Kloster Chin RN

## 2014-01-08 ENCOUNTER — Emergency Department (HOSPITAL_COMMUNITY): Payer: BC Managed Care – PPO

## 2014-01-08 ENCOUNTER — Inpatient Hospital Stay (HOSPITAL_COMMUNITY): Payer: BC Managed Care – PPO

## 2014-01-08 ENCOUNTER — Inpatient Hospital Stay (HOSPITAL_COMMUNITY)
Admission: EM | Admit: 2014-01-08 | Discharge: 2014-02-03 | DRG: 871 | Disposition: E | Payer: BC Managed Care – PPO | Attending: Internal Medicine | Admitting: Internal Medicine

## 2014-01-08 ENCOUNTER — Encounter (HOSPITAL_COMMUNITY): Payer: Self-pay | Admitting: Emergency Medicine

## 2014-01-08 DIAGNOSIS — R0602 Shortness of breath: Secondary | ICD-10-CM | POA: Diagnosis present

## 2014-01-08 DIAGNOSIS — Z79899 Other long term (current) drug therapy: Secondary | ICD-10-CM

## 2014-01-08 DIAGNOSIS — Z515 Encounter for palliative care: Secondary | ICD-10-CM | POA: Diagnosis not present

## 2014-01-08 DIAGNOSIS — E872 Acidosis, unspecified: Secondary | ICD-10-CM

## 2014-01-08 DIAGNOSIS — E119 Type 2 diabetes mellitus without complications: Secondary | ICD-10-CM | POA: Diagnosis present

## 2014-01-08 DIAGNOSIS — N179 Acute kidney failure, unspecified: Secondary | ICD-10-CM | POA: Diagnosis present

## 2014-01-08 DIAGNOSIS — B1910 Unspecified viral hepatitis B without hepatic coma: Secondary | ICD-10-CM | POA: Diagnosis present

## 2014-01-08 DIAGNOSIS — E785 Hyperlipidemia, unspecified: Secondary | ICD-10-CM | POA: Diagnosis present

## 2014-01-08 DIAGNOSIS — K5900 Constipation, unspecified: Secondary | ICD-10-CM | POA: Diagnosis present

## 2014-01-08 DIAGNOSIS — I129 Hypertensive chronic kidney disease with stage 1 through stage 4 chronic kidney disease, or unspecified chronic kidney disease: Secondary | ICD-10-CM | POA: Diagnosis present

## 2014-01-08 DIAGNOSIS — R531 Weakness: Secondary | ICD-10-CM

## 2014-01-08 DIAGNOSIS — G9340 Encephalopathy, unspecified: Secondary | ICD-10-CM | POA: Diagnosis present

## 2014-01-08 DIAGNOSIS — Z66 Do not resuscitate: Secondary | ICD-10-CM | POA: Diagnosis not present

## 2014-01-08 DIAGNOSIS — B181 Chronic viral hepatitis B without delta-agent: Secondary | ICD-10-CM

## 2014-01-08 DIAGNOSIS — J189 Pneumonia, unspecified organism: Secondary | ICD-10-CM | POA: Diagnosis present

## 2014-01-08 DIAGNOSIS — E873 Alkalosis: Secondary | ICD-10-CM | POA: Clinically undetermined

## 2014-01-08 DIAGNOSIS — J9600 Acute respiratory failure, unspecified whether with hypoxia or hypercapnia: Secondary | ICD-10-CM | POA: Diagnosis present

## 2014-01-08 DIAGNOSIS — R0681 Apnea, not elsewhere classified: Secondary | ICD-10-CM | POA: Diagnosis present

## 2014-01-08 DIAGNOSIS — I498 Other specified cardiac arrhythmias: Secondary | ICD-10-CM | POA: Diagnosis present

## 2014-01-08 DIAGNOSIS — K7689 Other specified diseases of liver: Secondary | ICD-10-CM | POA: Diagnosis present

## 2014-01-08 DIAGNOSIS — F411 Generalized anxiety disorder: Secondary | ICD-10-CM | POA: Diagnosis present

## 2014-01-08 DIAGNOSIS — C7800 Secondary malignant neoplasm of unspecified lung: Secondary | ICD-10-CM | POA: Diagnosis present

## 2014-01-08 DIAGNOSIS — N183 Chronic kidney disease, stage 3 unspecified: Secondary | ICD-10-CM | POA: Diagnosis present

## 2014-01-08 DIAGNOSIS — Z880 Allergy status to penicillin: Secondary | ICD-10-CM

## 2014-01-08 DIAGNOSIS — C439 Malignant melanoma of skin, unspecified: Secondary | ICD-10-CM | POA: Diagnosis present

## 2014-01-08 DIAGNOSIS — J90 Pleural effusion, not elsewhere classified: Secondary | ICD-10-CM | POA: Diagnosis present

## 2014-01-08 DIAGNOSIS — R6520 Severe sepsis without septic shock: Secondary | ICD-10-CM

## 2014-01-08 DIAGNOSIS — A419 Sepsis, unspecified organism: Secondary | ICD-10-CM | POA: Diagnosis present

## 2014-01-08 DIAGNOSIS — R918 Other nonspecific abnormal finding of lung field: Secondary | ICD-10-CM | POA: Diagnosis present

## 2014-01-08 DIAGNOSIS — C771 Secondary and unspecified malignant neoplasm of intrathoracic lymph nodes: Secondary | ICD-10-CM | POA: Diagnosis present

## 2014-01-08 DIAGNOSIS — G589 Mononeuropathy, unspecified: Secondary | ICD-10-CM | POA: Diagnosis present

## 2014-01-08 DIAGNOSIS — E874 Mixed disorder of acid-base balance: Secondary | ICD-10-CM | POA: Diagnosis present

## 2014-01-08 DIAGNOSIS — G609 Hereditary and idiopathic neuropathy, unspecified: Secondary | ICD-10-CM | POA: Diagnosis present

## 2014-01-08 DIAGNOSIS — I4891 Unspecified atrial fibrillation: Secondary | ICD-10-CM | POA: Diagnosis present

## 2014-01-08 DIAGNOSIS — Z8249 Family history of ischemic heart disease and other diseases of the circulatory system: Secondary | ICD-10-CM | POA: Diagnosis not present

## 2014-01-08 DIAGNOSIS — R079 Chest pain, unspecified: Secondary | ICD-10-CM

## 2014-01-08 DIAGNOSIS — I160 Hypertensive urgency: Secondary | ICD-10-CM

## 2014-01-08 DIAGNOSIS — R140 Abdominal distension (gaseous): Secondary | ICD-10-CM

## 2014-01-08 DIAGNOSIS — R55 Syncope and collapse: Secondary | ICD-10-CM | POA: Diagnosis present

## 2014-01-08 HISTORY — DX: Paroxysmal atrial fibrillation: I48.0

## 2014-01-08 HISTORY — DX: Chronic kidney disease, stage 3 (moderate): N18.3

## 2014-01-08 HISTORY — DX: Chronic kidney disease, stage 3 unspecified: N18.30

## 2014-01-08 LAB — BASIC METABOLIC PANEL
BUN: 31 mg/dL — AB (ref 6–23)
CALCIUM: 9.8 mg/dL (ref 8.4–10.5)
CHLORIDE: 102 meq/L (ref 96–112)
CO2: 19 meq/L (ref 19–32)
CREATININE: 1.17 mg/dL (ref 0.50–1.35)
GFR calc Af Amer: 72 mL/min — ABNORMAL LOW (ref >90)
GFR calc non Af Amer: 62 mL/min — ABNORMAL LOW (ref >90)
GLUCOSE: 235 mg/dL — AB (ref 70–99)
Potassium: 4 mEq/L (ref 3.7–5.3)
Sodium: 141 mEq/L (ref 137–147)

## 2014-01-08 LAB — CBC
HEMATOCRIT: 34.5 % — AB (ref 39.0–52.0)
Hemoglobin: 12.5 g/dL — ABNORMAL LOW (ref 13.0–17.0)
MCH: 31.3 pg (ref 26.0–34.0)
MCHC: 36.2 g/dL — ABNORMAL HIGH (ref 30.0–36.0)
MCV: 86.5 fL (ref 78.0–100.0)
PLATELETS: 119 10*3/uL — AB (ref 150–400)
RBC: 3.99 MIL/uL — ABNORMAL LOW (ref 4.22–5.81)
RDW: 13.1 % (ref 11.5–15.5)
WBC: 13.1 10*3/uL — ABNORMAL HIGH (ref 4.0–10.5)

## 2014-01-08 LAB — I-STAT ARTERIAL BLOOD GAS, ED
Acid-base deficit: 5 mmol/L — ABNORMAL HIGH (ref 0.0–2.0)
BICARBONATE: 18.2 meq/L — AB (ref 20.0–24.0)
O2 Saturation: 91 %
PH ART: 7.432 (ref 7.350–7.450)
Patient temperature: 98.6
TCO2: 19 mmol/L (ref 0–100)
pCO2 arterial: 27.2 mmHg — ABNORMAL LOW (ref 35.0–45.0)
pO2, Arterial: 57 mmHg — ABNORMAL LOW (ref 80.0–100.0)

## 2014-01-08 LAB — I-STAT TROPONIN, ED: Troponin i, poc: 0.01 ng/mL (ref 0.00–0.08)

## 2014-01-08 LAB — TROPONIN I
Troponin I: <0.3 ng/mL (ref <0.30)
Troponin I: <0.3 ng/mL (ref <0.30)

## 2014-01-08 LAB — MRSA PCR SCREENING: MRSA by PCR: NEGATIVE

## 2014-01-08 LAB — GLUCOSE, CAPILLARY: Glucose-Capillary: 201 mg/dL — ABNORMAL HIGH (ref 70–99)

## 2014-01-08 LAB — PRO B NATRIURETIC PEPTIDE: Pro B Natriuretic peptide (BNP): 305.4 pg/mL — ABNORMAL HIGH (ref 0–125)

## 2014-01-08 LAB — MAGNESIUM: MAGNESIUM: 2.1 mg/dL (ref 1.5–2.5)

## 2014-01-08 MED ORDER — IPRATROPIUM-ALBUTEROL 0.5-2.5 (3) MG/3ML IN SOLN
3.0000 mL | RESPIRATORY_TRACT | Status: DC
  Administered 2014-01-08 (×2): 3 mL via RESPIRATORY_TRACT
  Filled 2014-01-08 (×2): qty 3

## 2014-01-08 MED ORDER — IOHEXOL 350 MG/ML SOLN
70.0000 mL | Freq: Once | INTRAVENOUS | Status: AC | PRN
  Administered 2014-01-08: 70 mL via INTRAVENOUS

## 2014-01-08 MED ORDER — ONDANSETRON HCL 4 MG/2ML IJ SOLN: 4.0000 mg | Freq: Four times a day (QID) | INTRAMUSCULAR | Status: DC | PRN

## 2014-01-08 MED ORDER — MILK THISTLE 1000 MG PO CAPS: 1000.0000 mg | ORAL_CAPSULE | Freq: Every day | ORAL | Status: DC

## 2014-01-08 MED ORDER — TRAMADOL HCL 50 MG PO TABS
50.0000 mg | ORAL_TABLET | Freq: Three times a day (TID) | ORAL | Status: DC | PRN
  Administered 2014-01-08 – 2014-01-10 (×3): 100 mg via ORAL
  Administered 2014-01-11: 50 mg via ORAL
  Administered 2014-01-11 – 2014-01-12 (×2): 100 mg via ORAL
  Filled 2014-01-08 (×4): qty 2
  Filled 2014-01-08: qty 1
  Filled 2014-01-08: qty 2

## 2014-01-08 MED ORDER — DILTIAZEM HCL 25 MG/5ML IV SOLN
10.0000 mg | Freq: Once | INTRAVENOUS | Status: AC
  Administered 2014-01-08: 10 mg via INTRAVENOUS

## 2014-01-08 MED ORDER — GABAPENTIN 800 MG PO TABS
800.0000 mg | ORAL_TABLET | Freq: Three times a day (TID) | ORAL | Status: DC
  Filled 2014-01-08 (×2): qty 1

## 2014-01-08 MED ORDER — SODIUM CHLORIDE 0.9 % IV SOLN
250.0000 mL | INTRAVENOUS | Status: DC | PRN
  Administered 2014-01-13 – 2014-01-14 (×2): 250 mL via INTRAVENOUS

## 2014-01-08 MED ORDER — ONDANSETRON HCL 4 MG PO TABS: 4.0000 mg | ORAL_TABLET | Freq: Four times a day (QID) | ORAL | Status: DC | PRN

## 2014-01-08 MED ORDER — POLYETHYLENE GLYCOL 3350 17 G PO PACK
17.0000 g | PACK | Freq: Every day | ORAL | Status: DC
  Administered 2014-01-08 – 2014-01-11 (×4): 17 g via ORAL
  Filled 2014-01-08 (×5): qty 1

## 2014-01-08 MED ORDER — MORPHINE SULFATE 10 MG/5ML PO SOLN
2.5000 mg | ORAL | Status: DC
  Administered 2014-01-09 – 2014-01-10 (×5): 2.5 mg via ORAL
  Filled 2014-01-08 (×5): qty 5

## 2014-01-08 MED ORDER — GABAPENTIN 400 MG PO CAPS
800.0000 mg | ORAL_CAPSULE | Freq: Three times a day (TID) | ORAL | Status: DC
  Administered 2014-01-08 – 2014-01-12 (×12): 800 mg via ORAL
  Filled 2014-01-08 (×15): qty 2

## 2014-01-08 MED ORDER — DOCUSATE SODIUM 100 MG PO CAPS
100.0000 mg | ORAL_CAPSULE | Freq: Two times a day (BID) | ORAL | Status: DC
  Administered 2014-01-08 – 2014-01-12 (×8): 100 mg via ORAL
  Filled 2014-01-08 (×9): qty 1

## 2014-01-08 MED ORDER — ACETAMINOPHEN 500 MG PO TABS: 1000.0000 mg | ORAL_TABLET | Freq: Every day | ORAL | Status: DC | PRN

## 2014-01-08 MED ORDER — PREGABALIN 75 MG PO CAPS
75.0000 mg | ORAL_CAPSULE | Freq: Two times a day (BID) | ORAL | Status: DC
  Administered 2014-01-08 – 2014-01-12 (×8): 75 mg via ORAL
  Filled 2014-01-08 (×8): qty 1

## 2014-01-08 MED ORDER — DILTIAZEM LOAD VIA INFUSION
10.0000 mg | Freq: Once | INTRAVENOUS | Status: DC
  Administered 2014-01-08: 10 mg via INTRAVENOUS

## 2014-01-08 MED ORDER — METHYLPREDNISOLONE SODIUM SUCC 125 MG IJ SOLR
60.0000 mg | Freq: Three times a day (TID) | INTRAMUSCULAR | Status: DC
  Administered 2014-01-08 – 2014-01-11 (×8): 60 mg via INTRAVENOUS
  Filled 2014-01-08 (×2): qty 0.96
  Filled 2014-01-08: qty 2
  Filled 2014-01-08 (×5): qty 0.96
  Filled 2014-01-08: qty 2
  Filled 2014-01-08 (×2): qty 0.96

## 2014-01-08 MED ORDER — METHYLPREDNISOLONE SODIUM SUCC 40 MG IJ SOLR
40.0000 mg | Freq: Once | INTRAMUSCULAR | Status: AC
  Administered 2014-01-08: 40 mg via INTRAVENOUS
  Filled 2014-01-08: qty 1

## 2014-01-08 MED ORDER — IPRATROPIUM-ALBUTEROL 0.5-2.5 (3) MG/3ML IN SOLN
3.0000 mL | RESPIRATORY_TRACT | Status: DC
  Administered 2014-01-08 – 2014-01-11 (×15): 3 mL via RESPIRATORY_TRACT
  Filled 2014-01-08 (×16): qty 3

## 2014-01-08 MED ORDER — IOHEXOL 300 MG/ML  SOLN
25.0000 mL | INTRAMUSCULAR | Status: AC
  Administered 2014-01-08: 25 mL via ORAL

## 2014-01-08 MED ORDER — MORPHINE SULFATE 2 MG/ML IJ SOLN
1.0000 mg | INTRAMUSCULAR | Status: DC | PRN
  Administered 2014-01-10 – 2014-01-11 (×3): 1 mg via INTRAVENOUS
  Filled 2014-01-08 (×4): qty 1

## 2014-01-08 MED ORDER — GEMFIBROZIL 600 MG PO TABS
600.0000 mg | ORAL_TABLET | Freq: Two times a day (BID) | ORAL | Status: DC
  Administered 2014-01-09 – 2014-01-12 (×7): 600 mg via ORAL
  Filled 2014-01-08 (×9): qty 1

## 2014-01-08 MED ORDER — SODIUM CHLORIDE 0.9 % IJ SOLN
3.0000 mL | Freq: Two times a day (BID) | INTRAMUSCULAR | Status: DC
  Administered 2014-01-08 – 2014-01-14 (×8): 3 mL via INTRAVENOUS

## 2014-01-08 MED ORDER — DILTIAZEM HCL 100 MG IV SOLR
5.0000 mg/h | INTRAVENOUS | Status: DC
  Administered 2014-01-08: 15 mg/h via INTRAVENOUS
  Administered 2014-01-08: 10 mg/h via INTRAVENOUS

## 2014-01-08 MED ORDER — DILTIAZEM LOAD VIA INFUSION
10.0000 mg | Freq: Once | INTRAVENOUS | Status: DC
  Filled 2014-01-08: qty 10

## 2014-01-08 MED ORDER — MORPHINE SULFATE 2 MG/ML IJ SOLN
2.0000 mg | Freq: Once | INTRAMUSCULAR | Status: AC
  Administered 2014-01-08: 2 mg via INTRAVENOUS
  Filled 2014-01-08: qty 1

## 2014-01-08 MED ORDER — SENNOSIDES-DOCUSATE SODIUM 8.6-50 MG PO TABS
2.0000 | ORAL_TABLET | Freq: Every day | ORAL | Status: DC
  Administered 2014-01-08 – 2014-01-10 (×3): 2 via ORAL
  Filled 2014-01-08 (×3): qty 2

## 2014-01-08 MED ORDER — ALBUTEROL SULFATE (2.5 MG/3ML) 0.083% IN NEBU
2.5000 mg | INHALATION_SOLUTION | RESPIRATORY_TRACT | Status: DC | PRN
  Administered 2014-01-12 (×2): 2.5 mg via RESPIRATORY_TRACT
  Filled 2014-01-08 (×2): qty 3

## 2014-01-08 MED ORDER — FUROSEMIDE 10 MG/ML IJ SOLN
INTRAMUSCULAR | Status: AC
  Administered 2014-01-08: 20 mg via INTRAVENOUS
  Filled 2014-01-08: qty 4

## 2014-01-08 MED ORDER — FUROSEMIDE 10 MG/ML IJ SOLN
20.0000 mg | Freq: Once | INTRAMUSCULAR | Status: AC
  Administered 2014-01-08: 20 mg via INTRAVENOUS

## 2014-01-08 MED ORDER — INSULIN ASPART 100 UNIT/ML ~~LOC~~ SOLN
0.0000 [IU] | Freq: Three times a day (TID) | SUBCUTANEOUS | Status: DC
  Administered 2014-01-09: 3 [IU] via SUBCUTANEOUS
  Administered 2014-01-09: 2 [IU] via SUBCUTANEOUS
  Administered 2014-01-09: 7 [IU] via SUBCUTANEOUS
  Administered 2014-01-09: 3 [IU] via SUBCUTANEOUS

## 2014-01-08 MED ORDER — SODIUM CHLORIDE 0.9 % IJ SOLN: 3.0000 mL | INTRAMUSCULAR | Status: DC | PRN

## 2014-01-08 NOTE — ED Notes (Addendum)
Pt had episode where he started to cough up something and became apneic for 32 seconds with artifact on the monitor.  Monitor alarming for run of sustained VTach. This RN at the bedside during episode with family present.

## 2014-01-08 NOTE — Op Note (Signed)
NAMECROSS, Andrew NO.:  Church  MEDICAL RECORD NO.:  [id number]  LOCATION:  [facility]                         FACILITY:  [facility]  PHYSICIAN:  Andrew Bal, MD    DATE OF BIRTH:  1946/05/22  DATE OF PROCEDURE:  01/06/2014 DATE OF DISCHARGE:  01/06/2014                              OPERATIVE REPORT   PREOPERATIVE DIAGNOSIS:  Left lower lobe lung mass, and extensive mediastinal adenopathy.  POSTOPERATIVE DIAGNOSIS:  Left lower lobe lung mass, and extensive mediastinal adenopathy malignant by initial pathologic examination possible melanoma.  PROCEDURE PERFORMED:  Bronchoscopy with biopsy of left lower lobe lung mass, and EBUS with needle biopsy of #7 mediastinal lymph nodes.  SURGEON:  Andrew Bal, MD  BRIEF HISTORY:  The patient is a 68 year old male with a previous history of level 4 melanoma involving the right groin 15 years previously.  He presents now with several month history of increasing chest discomfort and increasing shortness of breath.  He underwent a Cardiology evaluation last month with no significant findings.  Because of persistent symptoms of cough and chest discomfort, he saw Dr. Royanne Foots who performed a chest x-ray and subsequent CT of the chest, which demonstrated a 3-cm left lower lobe lung nodule, lung mass, and extensive mediastinal adenopathy.  Because of the patient's symptoms, and need for tissue diagnosis, we proceeded the following day after seeing him with recommending bronchoscopy with biopsy and EBUS.  Risks and options were discussed with the patient.  He agreed and signed informed consent.  DESCRIPTION OF PROCEDURE:  The patient underwent general endotracheal anesthesia without incident.  Appropriate time-out was performed, we then proceeded with bronchoscopy through the endotracheal tube to the subsegmental level.  The patient had mild erythema noted in the bronchus and some bulging of the posterior membranous portions  of the trachea. The right lung was clear with no endobronchial lesions.  In the left lower lobe bronchus, there was evidence of  endobronchial tumor.  We then removed the scope, placed the EBUS scope and examined the mediastinal nodes with ultrasound.  There were extensive large #7 nodes easily visible on transbronchial biopsies.  These nodes were then performed with initial three passes stent as slides to pathology confirming malignancy.  Additional passes were placed in satellite for further testing.  The EBUS scope was then removed.  We went back with the standard bronchoscope through this brushings and biopsies of the left lower lobe lung mass were then obtained and also submitted to Pathology for permanent examination.  The large amount of bleeding in the tracheobronchial tree was suctioned clear.  The scope was removed. The patient was then extubated in the operating room having tolerated the procedure without obvious complication and was transferred to the recovery room for further postoperative care.  The patient will continue with the workup of his malignancy and has an appointment with Oncology in several days.     Andrew Bal, MD     EG/MEDQ  D:  01/07/2014  T:  22-Jan-2014  Job:  [id number]

## 2014-01-08 NOTE — ED Notes (Signed)
Pt states he had lung biopsy on the left side

## 2014-01-08 NOTE — ED Notes (Addendum)
Sob got worse this am just had liver bx 2 days ago states chest is tight and swollen

## 2014-01-08 NOTE — Progress Notes (Signed)
Patient had 10 second period of unresponsiveness with eyes noted to have rolled back after boosting and repositioning in bed.  BP elevated to 716R systolic.  Spouse at the bedside states the patient had a similar episode first noted at home on 3/5, and another today in the ED that lasted longer.  No arrhythmias noted.  Dr. Rogue Bussing notified and EKG obtained.

## 2014-01-08 NOTE — ED Notes (Signed)
Spoke with Dr. Tyrell Antonio about MRI and coming off of cardizem and monitor. MD wants to wait until pt is more stable so to do the MRI tomorrow. MRI made aware.

## 2014-01-08 NOTE — ED Notes (Addendum)
Dr. Haroldine Laws at the bedside.

## 2014-01-08 NOTE — ED Notes (Signed)
Dr. Tyrell Antonio back at the bedside.

## 2014-01-08 NOTE — Consult Note (Addendum)
Patient ID: Andrew Church MRN: [id number], DOB/AGE: November 03, 1946   Admit date: 01/09/2014  Primary Physician: Simona Huh, MD Primary Cardiologist: Liane Comber, MD   Pt. Profile:  68 y/o male with recent dx of metastatic melanoma of the lung s/p bronch on 3/4, who presented to the ED to day with progressive dyspnea, hemoptysis, and afib rvr.  Problem List  Past Medical History  Diagnosis Date  . Neuropathy   . Prediabetes   . HTN (hypertension)   . Hyperlipemia   . Melanoma     a. 04/2003;  b. 01/2014 metastatic to the lung s/p bronch.  . Hx of cardiovascular stress test     a.  Lexiscan Myoview (11/2013): EF 62%, no ischemia; normal study.  . Fatty liver   . Diabetes mellitus     10/2004  . ED (erectile dysfunction)   . Hepatitis B carrier     per [facility] 2013  . CKD (chronic kidney disease)     stage 3  . PAF (paroxysmal atrial fibrillation)     a. dx 01/2014;  b. CHA2DS2VASc = 3.    Past Surgical History  Procedure Laterality Date  . Foot surgery    . Right ankle fracture      12/2005  . Lasik    . Hydrocele excision    . Melanoma excision    . Video bronchoscopy with endobronchial ultrasound N/A 01/06/2014    Procedure: VIDEO BRONCHOSCOPY WITH ENDOBRONCHIAL ULTRASOUND;  Surgeon: Grace Isaac, MD;  Location: [facility] OR;  Service: Thoracic;  Laterality: N/A;  . Bronchial needle aspiration biopsy N/A 01/06/2014    Procedure: NEEDLE BIOPSY;  Surgeon: Grace Isaac, MD;  Location: [place];  Service: Thoracic;  Laterality: N/A;  . Mediastinoscopy N/A 01/06/2014    Procedure: MEDIASTINOSCOPY;  Surgeon: Grace Isaac, MD;  Location: [place];  Service: Thoracic;  Laterality: N/A;     Allergies  Allergies  Allergen Reactions  . Penicillins Rash    HPI  68 y/o male with a h/o chest pain and dyspnea s/p neg cardiolite in January of this year.  He also has a h/o malignant melanoma and more recently cough, along with the chest pain and dyspnea.  A CXR was done showing a  LLL mass and this was followed by CT showing a 5 cm central LLL mass suspicious for primary bronchogenic neoplasm with associated thoracic lymphadenopathy.  He underwent bronchoscopy on 3/4 and pathology is consistent with metastatic malignant melanoma.  Since his bronchoscopy, he has been more dyspneic than usual and he has also been coughing up blood.  Because of worsening dyspnea this morning he presented to the [facility] ED where he was found to be in afib with RVR in the 170's.  He was placed on IV dilt and eventually converted to sinus rhythm.  We have been asked to eval.  Of note, while coughing and trying to clear his secretions he reportedly had an apneic episode.  Notes suggest that he had NSVT however review of tele reveals artifact.  He cont to c/o dyspnea and remains in sinus rhythm.  Home Medications  Prior to Admission medications   Medication Sig Start Date End Date Taking? Authorizing Provider  acetaminophen (TYLENOL) 500 MG tablet Take 1,000 mg by mouth daily as needed (neuropathy).    Yes Historical Provider, MD  amLODipine (NORVASC) 5 MG tablet Take 5 mg by mouth daily.   Yes Historical Provider, MD  aspirin EC 81 MG tablet Take 81  mg by mouth daily.   Yes Historical Provider, MD  ASTRAGALUS PO Take 470 mg by mouth daily.   Yes Historical Provider, MD  b complex vitamins tablet Take 1 tablet by mouth daily.   Yes Historical Provider, MD  Cinnamon 500 MG capsule Take 500 mg by mouth daily.   Yes Historical Provider, MD  Coenzyme Q10 (CO Q 10 PO) Take 1 tablet by mouth daily.   Yes Historical Provider, MD  doxycycline (VIBRAMYCIN) 100 MG capsule Take 100 mg by mouth 2 (two) times daily.   Yes Historical Provider, MD  gabapentin (NEURONTIN) 800 MG tablet Take 800 mg by mouth 3 (three) times daily.   Yes Historical Provider, MD  gemfibrozil (LOPID) 600 MG tablet Take 600 mg by mouth 2 (two) times daily.   Yes Historical Provider, MD  Ginkgo Biloba 40 MG TABS Take 120 mg by mouth daily.    Yes Historical Provider, MD  glipiZIDE (GLUCOTROL) 5 MG tablet Take 5 mg by mouth daily before breakfast.    Yes Historical Provider, MD  Glucosamine-Chondroit-Vit C-Mn (GLUCOSAMINE CHONDR 1500 COMPLX) CAPS Take 1 capsule by mouth 2 (two) times daily.   Yes Historical Provider, MD  ibuprofen (ADVIL,MOTRIN) 200 MG tablet Take 400 mg by mouth 2 (two) times daily as needed (neuropathy).    Yes Historical Provider, MD  lisinopril (PRINIVIL,ZESTRIL) 20 MG tablet Take 20 mg by mouth daily with supper.    Yes Historical Provider, MD  Milk Thistle 1000 MG CAPS Take 1,000 mg by mouth daily with supper.    Yes Historical Provider, MD  naproxen sodium (ALEVE) 220 MG tablet Take 440 mg by mouth daily as needed (neuropathy).    Yes Historical Provider, MD  Omega-3 Fatty Acids (FISH OIL) 500 MG CAPS Take 500 mg by mouth daily.   Yes Historical Provider, MD  pregabalin (LYRICA) 75 MG capsule Take 1 capsule (75 mg total) by mouth 2 (two) times daily. 11/23/13  Yes Dorothy Spark, MD  Red Yeast Rice 600 MG CAPS Take 600 mg by mouth daily.   Yes Historical Provider, MD  SALINE NA Place 1 spray into both nostrils daily as needed (congestion).   Yes Historical Provider, MD  traMADol (ULTRAM) 50 MG tablet Take 50-200 mg by mouth every 8 (eight) hours as needed (pain).    Yes Historical Provider, MD   Family History  Family History  Problem Relation Age of Onset  . Heart attack Mother   . Emphysema Father    Social History  History   Social History  . Marital Status: Married    Spouse Name: N/A    Number of Children: N/A  . Years of Education: N/A   Occupational History  . Not on file.   Social History Main Topics  . Smoking status: Never Smoker   . Smokeless tobacco: Never Used  . Alcohol Use: No  . Drug Use: No  . Sexual Activity: No   Other Topics Concern  . Not on file   Social History Narrative  . No narrative on file    Review of Systems General:  No chills, fever, night sweats or  weight changes.  Cardiovascular:  +++ chest pain, +++ dyspnea on exertion, no edema, orthopnea, palpitations, paroxysmal nocturnal dyspnea. Dermatological: No rash, lesions/masses Respiratory: +++ cough, dyspnea, hemoptysis. Urologic: No hematuria, dysuria Abdominal:   No nausea, vomiting, diarrhea, bright red blood per rectum, melena, or hematemesis Neurologic:  No visual changes, wkns, changes in mental status. All other systems  reviewed and are otherwise negative except as noted above.  Physical Exam  Blood pressure 162/72, pulse 99, temperature 98.6 F (37 C), resp. rate 23, SpO2 96.00%.  General: Pleasant, tachypneic, intermittently coughing and audibly wheezing @ times. Psych: Normal affect. Neuro: Alert and oriented X 3. Moves all extremities spontaneously. HEENT: Normal  Neck: Supple without bruits or JVD. Lungs:  Resp regular and unlabored, coarse breath sounds with occas exp wheezing.  No rales. Heart: RRR no s3, s4, or murmurs. Abdomen: Soft, non-tender, non-distended, BS + x 4.  Extremities: No clubbing, cyanosis or edema. DP/PT/Radials 2+ and equal bilaterally.  Labs  Troponin [facility] of Care Test)  Recent Labs  01/29/2014 1201  TROPIPOC 0.01   Lab Results  Component Value Date   WBC 13.1* 01/05/2014   HGB 12.5* 01/03/2014   HCT 34.5* 01/04/2014   MCV 86.5 01/18/2014   PLT 119* 01/30/2014    Recent Labs Lab 01/06/14 0718 01/20/2014 1150  NA 140 141  K 4.9 4.0  CL 101 102  CO2 22 19  BUN 39* 31*  CREATININE 1.31 1.17  CALCIUM 10.4 9.8  PROT 7.2  --   BILITOT 0.4  --   ALKPHOS 128*  --   ALT 31  --   AST 50*  --   GLUCOSE 133* 235*   Radiology/Studies  Chest 2 View  01/06/2014   CLINICAL DATA:  Lung mass.  Preop.  EXAM: CHEST  2 VIEW   IMPRESSION: 1. Likely malignant left lung mass and lymphadenopathy. 2. Trace bilateral pleural effusions, unchanged from 12/29/2013.   Electronically Signed   By: Jorje Guild M.D.   On: 01/06/2014 07:16   Ct Angio Chest Pe  W/cm &/or Wo Cm  01/25/2014   CLINICAL DATA:  Left lower lobe mass. Status post bronchoscopy and biopsy 2 days ago. Worsening shortness of breath. Hemoptysis.  EXAM: CT ANGIOGRAPHY CHEST WITH CONTRAST    IMPRESSION: Negative for pulmonary embolus. Both the descending left interlobar and ascending right interlobar pulmonary arteries are compressed by lymphadenopathy and the left lower lobe mass.  No change in bulky mediastinal lymphadenopathy and a left lower lobe pulmonary mass.  Some increase in a moderate right and small left pleural effusion.  1.1 cm nodular opacity right upper lobe is new since CT scan 2 weeks ago and indeterminate. Recommend attention on follow-up exams.   Electronically Signed   By: Inge Rise M.D.   On: 01/29/2014 14:33   Dg Abd Acute W/chest  01/14/2014   CLINICAL DATA:  Shortness of breath.  Abdominal pain and distention.  EXAM: ACUTE ABDOMEN SERIES (ABDOMEN 2 VIEW & CHEST 1 VIEW)    IMPRESSION: Left lower lobe mass and mediastinal lymphadenopathy as seen on prior CT. Small bilateral pleural effusions are identified.  Negative for free intraperitoneal air or bowel obstruction.  Gaseous distention of the stomach.  Large volume of stool throughout the colon.   Electronically Signed   By: Inge Rise M.D.   On: 01/13/2014 13:51   ECG: Nsr, 89, no acute st/t changes.  ASSESSMENT AND PLAN  1.  Malignant Melanoma with Lung Metastasis:  S/p recent bronch and now with progressive dyspnea and hemoptysis.  Mgmt per IM/CCM.  2.  Afib RVR:  Now back in sinus. Convert dilt to PO.  If recurs, consider adding amio.  CHA2DS2VASc = 3.  Not currently a candidate for anticoagulation 2/2 hemoptysis and metastatic CA.  Signed, Murray Hodgkins, NP 01/25/2014, 4:56 PM  Patient seen and examined  independently. Emeline Gins, NP note reviewed carefully - agree with his assessment and plan. I have edited the note based on my findings.   68 y/o male as above with no h/o CAD (recent  negative stress test). Recently found to have metastatic melanoma. Presented today with worsening dyspnea. Had transient AF with RVR which has now spontaneously converted to NSR. Being evaluated by CCM and [facility]. Agree with adding po diltiazem. If AF recurs can add amio. Will follow at a distance. Please call with questions.   Benay Spice 5:38 PM

## 2014-01-08 NOTE — ED Provider Notes (Signed)
CSN: [id number]     Arrival date & time 01/24/2014  1130 History   First MD Initiated Contact with Patient 01/24/2014 1140     Chief Complaint  Patient presents with  . Shortness of Breath     (Consider location/radiation/quality/duration/timing/severity/associated sxs/prior Treatment) HPI Pt presenting with c/o increased weakness, shortness of breath.  He states he has been short of breath for the past several weeks and had increased weakness this morning.  Also feels that he is bloated and has been constipated for the past several days.  Pt had biopsy of lung mass 2 days ago- also has mediastinal lymphadenopathy associated with this.  Pt has hx of prior melanoma and biopsy reveals malignant cells per chart review- pt is scheduled for follow up with  Oncology.  There are no other associated systemic symptoms, there are no other alleviating or modifying factors.   Past Medical History  Diagnosis Date  . Neuropathy   . Prediabetes   . HTN (hypertension)   . Hyperlipemia   . Melanoma     04/2003  . Hx of cardiovascular stress test     a.  Lexiscan Myoview (11/2013): EF 62%, no ischemia; normal study.  . Fatty liver   . Diabetes mellitus     10/2004  . ED (erectile dysfunction)   . Hepatitis B carrier     per [facility] 2013  . CKD (chronic kidney disease)     stage 3   Past Surgical History  Procedure Laterality Date  . Foot surgery    . Right ankle fracture      12/2005  . Lasik    . Hydrocele excision    . Melanoma excision    . Video bronchoscopy with endobronchial ultrasound N/A 01/06/2014    Procedure: VIDEO BRONCHOSCOPY WITH ENDOBRONCHIAL ULTRASOUND;  Surgeon: Grace Isaac, MD;  Location: [facility] OR;  Service: Thoracic;  Laterality: N/A;  . Bronchial needle aspiration biopsy N/A 01/06/2014    Procedure: NEEDLE BIOPSY;  Surgeon: Grace Isaac, MD;  Location: [place];  Service: Thoracic;  Laterality: N/A;  . Mediastinoscopy N/A 01/06/2014    Procedure: MEDIASTINOSCOPY;  Surgeon: Grace Isaac, MD;  Location: [facility] OR;  Service: Thoracic;  Laterality: N/A;   Family History  Problem Relation Age of Onset  . Heart attack Mother   . Emphysema Father    History  Substance Use Topics  . Smoking status: Never Smoker   . Smokeless tobacco: Never Used  . Alcohol Use: No    Review of Systems ROS reviewed and all otherwise negative except for mentioned in HPI    Allergies  Penicillins  Home Medications   Current Outpatient Rx  Name  Route  Sig  Dispense  Refill  . acetaminophen (TYLENOL) 500 MG tablet   Oral   Take 1,000 mg by mouth daily as needed (neuropathy).          Marland Kitchen amLODipine (NORVASC) 5 MG tablet   Oral   Take 5 mg by mouth daily.         Marland Kitchen aspirin EC 81 MG tablet   Oral   Take 81 mg by mouth daily.         . ASTRAGALUS PO   Oral   Take 470 mg by mouth daily.         Marland Kitchen b complex vitamins tablet   Oral   Take 1 tablet by mouth daily.         . Cinnamon 500 MG  capsule   Oral   Take 500 mg by mouth daily.         . Coenzyme Q10 (CO Q 10 PO)   Oral   Take 1 tablet by mouth daily.         Marland Kitchen doxycycline (VIBRAMYCIN) 100 MG capsule   Oral   Take 100 mg by mouth 2 (two) times daily.         Marland Kitchen gabapentin (NEURONTIN) 800 MG tablet   Oral   Take 800 mg by mouth 3 (three) times daily.         Marland Kitchen gemfibrozil (LOPID) 600 MG tablet   Oral   Take 600 mg by mouth 2 (two) times daily.         . Ginkgo Biloba 40 MG TABS   Oral   Take 120 mg by mouth daily.         Marland Kitchen glipiZIDE (GLUCOTROL) 5 MG tablet   Oral   Take 5 mg by mouth daily before breakfast.          . Glucosamine-Chondroit-Vit C-Mn (GLUCOSAMINE CHONDR 1500 COMPLX) CAPS   Oral   Take 1 capsule by mouth 2 (two) times daily.         Marland Kitchen ibuprofen (ADVIL,MOTRIN) 200 MG tablet   Oral   Take 400 mg by mouth 2 (two) times daily as needed (neuropathy).          Marland Kitchen lisinopril (PRINIVIL,ZESTRIL) 20 MG tablet   Oral   Take 20 mg by mouth daily with supper.           . Milk Thistle 1000 MG CAPS   Oral   Take 1,000 mg by mouth daily with supper.          . naproxen sodium (ALEVE) 220 MG tablet   Oral   Take 440 mg by mouth daily as needed (neuropathy).          . Omega-3 Fatty Acids (FISH OIL) 500 MG CAPS   Oral   Take 500 mg by mouth daily.         . pregabalin (LYRICA) 75 MG capsule   Oral   Take 1 capsule (75 mg total) by mouth 2 (two) times daily.   60 capsule   6   . Red Yeast Rice 600 MG CAPS   Oral   Take 600 mg by mouth daily.         Marland Kitchen SALINE NA   Each Nare   Place 1 spray into both nostrils daily as needed (congestion).         . traMADol (ULTRAM) 50 MG tablet   Oral   Take 50-200 mg by mouth every 8 (eight) hours as needed (pain).           BP 162/72  Pulse 99  Temp(Src) 98.6 F (37 C)  Resp 23  SpO2 96% Vitals reivewed Physical Exam Physical Examination: General appearance - alert, well appearing, and in no distress Mental status - alert, oriented to person, place, and time Eyes - no conjunctival injection, no scleral icterus Mouth - mucous membranes moist, pharynx normal without lesions Chest - clear to auscultation, no wheezes, rales or rhonchi, symmetric air entry Heart - tachycardic, regular rhythm, normal S1, S2, no murmurs, rubs, clicks or gallops Abdomen - soft, nontender, mild distension of abdomen, nontender, no masses or organomegaly Extremities - peripheral pulses normal, no pedal edema, no clubbing or cyanosis Skin - normal coloration and turgor, no rashes  ED  Course  Procedures (including critical care time)   Date: 01/31/2014  Rate: 172  Rhythm: atrial fibrillation with rapid ventricular response  QRS Axis: normal  Intervals: normal  ST/T Wave abnormalities: normal  Conduction Disutrbances:none  Narrative Interpretation:   Old EKG Reviewed: changes noted  CRITICAL CARE Performed by: Threasa Beards Total critical care time: 45 Critical care time was exclusive of separately  billable procedures and treating other patients. Critical care was necessary to treat or prevent imminent or life-threatening deterioration. Critical care was time spent personally by me on the following activities: development of treatment plan with patient and/or surrogate as well as nursing, discussions with consultants, evaluation of patient's response to treatment, examination of patient, obtaining history from patient or surrogate, ordering and performing treatments and interventions, ordering and review of laboratory studies, ordering and review of radiographic studies, pulse oximetry and re-evaluation of patient's condition.  1:01 PM on recheck pt remains in afib, HR in 100s down from 150s, maintaining blood pressure well.  Pt remains somewhat short of breath.  Awaiting cxr/abd xray and CT angio chest  2:26 PM pt is now in sinus rhythm  3:16 PM d/w [facility] for admission, they will admit to stepdown, request consult to Dr. Servando Snare- consult has been requested.   3:46 PM d/w Dr. Servando Snare, he will consult on patient, feels there is no acute intervention that he needs to be involved in at this time.       Labs Review Labs Reviewed  BASIC METABOLIC PANEL - Abnormal; Notable for the following:    Glucose, Bld 235 (*)    BUN 31 (*)    GFR calc non Af Amer 62 (*)    GFR calc Af Amer 72 (*)    All other components within normal limits  CBC - Abnormal; Notable for the following:    WBC 13.1 (*)    RBC 3.99 (*)    Hemoglobin 12.5 (*)    HCT 34.5 (*)    MCHC 36.2 (*)    Platelets 119 (*)    All other components within normal limits  MAGNESIUM  PRO B NATRIURETIC PEPTIDE  TROPONIN I  TROPONIN I  TROPONIN I  BLOOD GAS, ARTERIAL  I-STAT TROPOININ, ED  I-STAT ARTERIAL BLOOD GAS, ED   Imaging Review Ct Angio Chest Pe W/cm &/or Wo Cm  01/14/2014   CLINICAL DATA:  Left lower lobe mass. Status post bronchoscopy and biopsy 2 days ago. Worsening shortness of breath. Hemoptysis.  EXAM: CT  ANGIOGRAPHY CHEST WITH CONTRAST  TECHNIQUE: Multidetector CT imaging of the chest was performed using the standard protocol during bolus administration of intravenous contrast. Multiplanar CT image reconstructions and MIPs were obtained to evaluate the vascular anatomy.  CONTRAST:  70 mL OMNIPAQUE IOHEXOL 350 MG/ML SOLN  COMPARISON:  CT chest 12/31/2013. Chest in two views abdomen earlier this same day.  FINDINGS: There is a moderate right and small left pleural effusion. No pericardial effusion is identified. No pulmonary embolus is identified. The descending left interlobar and ascending right interlobar pulmonary arteries are narrowed by the patient's left lower lobe mass and mediastinal adenopathy.  Bulky mediastinal lymphadenopathy is unchanged. Again seen is a 2.0 cm right paratracheal node on image 28, a 3.1 cm subcarinal node on image 47 and a 2.7 cm left subcarinal lymph node on image 52. 5 cm in diameter left lower lobe mass is again identified. Increased compressive atelectasis is seen in the bases. There is a new nodular opacity in the right upper  lobe measuring 1.1 x 0.8 cm on image 27. Incidentally imaged upper abdomen is unremarkable. No focal bony abnormality is identified.  Review of the MIP images confirms the above findings.  IMPRESSION: Negative for pulmonary embolus. Both the descending left interlobar and ascending right interlobar pulmonary arteries are compressed by lymphadenopathy and the left lower lobe mass.  No change in bulky mediastinal lymphadenopathy and a left lower lobe pulmonary mass.  Some increase in a moderate right and small left pleural effusion.  1.1 cm nodular opacity right upper lobe is new since CT scan 2 weeks ago and indeterminate. Recommend attention on follow-up exams.   Electronically Signed   By: Inge Rise M.D.   On: 02/01/2014 14:33   Dg Abd Acute W/chest  01/14/2014   CLINICAL DATA:  Shortness of breath.  Abdominal pain and distention.  EXAM: ACUTE ABDOMEN  SERIES (ABDOMEN 2 VIEW & CHEST 1 VIEW)  COMPARISON:  CT chest 12/31/2013 and PA and lateral chest 01/06/2014.  FINDINGS: Single view of the chest demonstrates bulky mediastinal lymphadenopathy. Left lower lobe mass is seen as on the prior study. There are small bilateral pleural effusions. No pneumothorax.  Two views of the abdomen show no free intraperitoneal air. The stomach appears distended. There is no evidence of small bowel obstruction. Large volume of stool throughout the colon is noted.  IMPRESSION: Left lower lobe mass and mediastinal lymphadenopathy as seen on prior CT. Small bilateral pleural effusions are identified.  Negative for free intraperitoneal air or bowel obstruction.  Gaseous distention of the stomach.  Large volume of stool throughout the colon.   Electronically Signed   By: Inge Rise M.D.   On: 01/13/2014 13:51     EKG Interpretation   Date/Time:  Friday January 08 2014 14:13:34 EST Ventricular Rate:  89 PR Interval:  139 QRS Duration: 105 QT Interval:  352 QTC Calculation: 428 R Axis:   81 Text Interpretation:  Sinus rhythm Abnormal R-wave progression, early  transition Since previous tracing afib has been replaced with sinus rhythm  Confirmed by Canary Brim  MD, Carlton Buskey [id number]) on 01/22/2014 3:32:33 PM      MDM   Final diagnoses:  Rapid atrial fibrillation  Lung mass  Shortness of breath  Weakness    Pt presenting with c/o generalized weakness. Pt has had shortness of breath over the past several weeks. Had biopsy of lung mass 2 days ago.  Workup today shows rapid afib- pt was placed on diltiazem drip and converted to NSR in the ED.  Acute abd series due to some abdominal bloating shows gastric distension and large stool burden, abdomen is nontender on exam.  CT angio shows no PE, continues to show lung mass, mediastinal LAD.  D/w [facility] for admission- see notes above.      Threasa Beards, MD 01/11/2014 [phone]

## 2014-01-08 NOTE — ED Notes (Signed)
CCM at the bedside for a consult

## 2014-01-08 NOTE — ED Notes (Signed)
Pt back from CT scan. Has wheezing now where he didn't on arrival.

## 2014-01-08 NOTE — Progress Notes (Signed)
[facility] hospitalist progress note. Chief complaint. Syncope versus apneic event. History of present illness. This 68 year old male hospitalized with complaints of worsened dyspnea. Patient had an episode in the emergency room where he became apneic and syncopal. Monitor at that time showed V. tach. Patient had a second episode that occurred while nursing leg patient flat to readjust his position in the bed. Patient became nonresponsive for about 10 seconds to verbal or tactile stimuli. He was noted to be hypertensive with systolic blood pressure of about 200 but this has quickly normalized after patient regained consciousness. Patient has no recollection of the incident. He denies chest pain or worsened dyspnea. Vital signs. Temperature 99.6, pulse 102, respiration 19, blood pressure 206/81. O2 sats 95%. Blood pressure has declined significantly from that quoted. General appearance. Frail elderly male who is alert and in no distress. Cardiac. Regular with occasional irregular beats. Lungs. Breath sounds reduced with some scattered rhonchi. No distress and stable O2 sats. Abdomen. Soft with positive bowel sounds. No pain. Neurologic. Cranial nerves 2-12 grossly intact. No unilateral or focal defects. Impression/plan. Problem #1 syncope versus apneic event. A 12-lead EKG was obtained and this does not look significantly different from prior EKGs. Central monitoring did not note any arrhythmias during the event per nursing report. Patient clinically appears quite stable at this time. I asked staff to update cardiology who is already on the case. Troponins are being cycled and we'll follow these results for any elevation.

## 2014-01-08 NOTE — Progress Notes (Signed)
PHARMACIST - PHYSICIAN ORDER COMMUNICATION  CONCERNING: P&T Medication Policy on Herbal Medications  DESCRIPTION:  This patient's order for:  Milk thistle  has been noted.  This product(s) is classified as an "herbal" or natural product. Due to a lack of definitive safety studies or FDA approval, nonstandard manufacturing practices, plus the potential risk of unknown drug-drug interactions while on inpatient medications, the Pharmacy and Therapeutics Committee does not permit the use of "herbal" or natural products of this type within [facility].   ACTION TAKEN: The pharmacy department is unable to verify this order at this time and your patient has been informed of this safety policy. Please reevaluate patient's clinical condition at discharge and address if the herbal or natural product(s) should be resumed at that time. Eudelia Bunch, Pharm.D. [phone] 01/24/2014 8:46 PM

## 2014-01-08 NOTE — H&P (Signed)
[facility] History and Physical  Andrew Church ELF:[id number] DOB: [birth year] DOA: 01-30-14  Referring physician: Dr Canary Brim.  PCP: Simona Huh, MD   Chief Complaint: Worsening Dyspnea.   HPI: Andrew Church is a 68 y.o. 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 He is S/P bronchoscopy 3-05 by Dr Roxy Horseman. Patient presents to the ED complaining of worsening dyspnea for last 2 days. He has been having dyspnea for last several weeks. Dyspnea got worse today. He has been coughing blood since he had the bronchoscopy.  In the ED patient was found to be in A fib RVR. He was started on Cardizem Gtt. HR now in the 80. He develops Wheezing in the ED.   Patient had an episode of unresponsiveness in the ED. Per nurse report, he was coughing, was shocking, then became apneic and eyes roll back. Episode lasted 30 sec. Monitor show VT. Per nurse patient always maintain pulse. Patient is now back to baseline.    Review of Systems:  Negative except as per HPI.   Past Medical History  Diagnosis Date  . Neuropathy   . Prediabetes   . HTN (hypertension)   . Hyperlipemia   . Melanoma     04/2003  . Hx of cardiovascular stress test     a.  Lexiscan Myoview (11/2013): EF 62%, no ischemia; normal study.  . Fatty liver   . Diabetes mellitus     10/2004  . ED (erectile dysfunction)   . Hepatitis B carrier     per [facility] 2013  . CKD (chronic kidney disease)     stage 3   Past Surgical History  Procedure Laterality Date  . Foot surgery    . Right ankle fracture      12/2005  . Lasik    . Hydrocele excision    . Melanoma excision    . Video bronchoscopy with endobronchial ultrasound N/A 01/06/2014    Procedure: VIDEO BRONCHOSCOPY WITH ENDOBRONCHIAL ULTRASOUND;  Surgeon: Grace Isaac, MD;  Location: [facility] OR;  Service: Thoracic;  Laterality: N/A;  . Bronchial needle aspiration biopsy N/A 01/06/2014    Procedure: NEEDLE BIOPSY;   Surgeon: Grace Isaac, MD;  Location: [place];  Service: Thoracic;  Laterality: N/A;  . Mediastinoscopy N/A 01/06/2014    Procedure: MEDIASTINOSCOPY;  Surgeon: Grace Isaac, MD;  Location: [place];  Service: Thoracic;  Laterality: N/A;   Social History:  reports that he has never smoked. He has never used smokeless tobacco. He reports that he does not drink alcohol or use illicit drugs.  Allergies  Allergen Reactions  . Penicillins Rash    Family History  Problem Relation Age of Onset  . Heart attack Mother   . Emphysema Father      Prior to Admission medications   Medication Sig Start Date End Date Taking? Authorizing Provider  acetaminophen (TYLENOL) 500 MG tablet Take 1,000 mg by mouth daily as needed (neuropathy).    Yes Historical Provider, MD  amLODipine (NORVASC) 5 MG tablet Take 5 mg by mouth daily.   Yes Historical Provider, MD  aspirin EC 81 MG tablet Take 81 mg by mouth daily.   Yes Historical Provider, MD  ASTRAGALUS PO Take 470 mg by mouth daily.   Yes Historical Provider, MD  b complex vitamins tablet Take 1 tablet by mouth daily.   Yes Historical Provider, MD  Cinnamon 500 MG capsule Take 500 mg by mouth  daily.   Yes Historical Provider, MD  Coenzyme Q10 (CO Q 10 PO) Take 1 tablet by mouth daily.   Yes Historical Provider, MD  doxycycline (VIBRAMYCIN) 100 MG capsule Take 100 mg by mouth 2 (two) times daily.   Yes Historical Provider, MD  gabapentin (NEURONTIN) 800 MG tablet Take 800 mg by mouth 3 (three) times daily.   Yes Historical Provider, MD  gemfibrozil (LOPID) 600 MG tablet Take 600 mg by mouth 2 (two) times daily.   Yes Historical Provider, MD  Ginkgo Biloba 40 MG TABS Take 120 mg by mouth daily.   Yes Historical Provider, MD  glipiZIDE (GLUCOTROL) 5 MG tablet Take 5 mg by mouth daily before breakfast.    Yes Historical Provider, MD  Glucosamine-Chondroit-Vit C-Mn (GLUCOSAMINE CHONDR 1500 COMPLX) CAPS Take 1 capsule by mouth 2 (two) times daily.   Yes  Historical Provider, MD  ibuprofen (ADVIL,MOTRIN) 200 MG tablet Take 400 mg by mouth 2 (two) times daily as needed (neuropathy).    Yes Historical Provider, MD  lisinopril (PRINIVIL,ZESTRIL) 20 MG tablet Take 20 mg by mouth daily with supper.    Yes Historical Provider, MD  Milk Thistle 1000 MG CAPS Take 1,000 mg by mouth daily with supper.    Yes Historical Provider, MD  naproxen sodium (ALEVE) 220 MG tablet Take 440 mg by mouth daily as needed (neuropathy).    Yes Historical Provider, MD  Omega-3 Fatty Acids (FISH OIL) 500 MG CAPS Take 500 mg by mouth daily.   Yes Historical Provider, MD  pregabalin (LYRICA) 75 MG capsule Take 1 capsule (75 mg total) by mouth 2 (two) times daily. 11/23/13  Yes Dorothy Spark, MD  Red Yeast Rice 600 MG CAPS Take 600 mg by mouth daily.   Yes Historical Provider, MD  SALINE NA Place 1 spray into both nostrils daily as needed (congestion).   Yes Historical Provider, MD  traMADol (ULTRAM) 50 MG tablet Take 50-200 mg by mouth every 8 (eight) hours as needed (pain).    Yes Historical Provider, MD   Physical Exam: Filed Vitals:   01/09/2014 1515  BP: 143/76  Pulse: 86  Temp:   Resp: 23    BP 143/76  Pulse 86  Temp(Src) 98.6 F (37 C)  Resp 23  SpO2 97%  General:  Alert, tachypnea, respiratory distress.  Eyes: PERRL, normal lids, irises & conjunctiva ENT: grossly normal hearing, lips & tongue Neck: no LAD, masses or thyromegaly Cardiovascular: IRR, no m/r/g. No LE edema. Respiratory: Bilateral expiratory wheezes, and ronchus.  Abdomen: BS decreases, very distended, NT, tight.  Skin: no rash or induration seen on limited exam Musculoskeletal: grossly normal tone BUE/BLE Neurologic: grossly non-focal.          Labs on Admission:  Basic Metabolic Panel:  Recent Labs Lab 01/06/14 0718 01/27/2014 1150  NA 140 141  K 4.9 4.0  CL 101 102  CO2 22 19  GLUCOSE 133* 235*  BUN 39* 31*  CREATININE 1.31 1.17  CALCIUM 10.4 9.8  MG  --  2.1   Liver  Function Tests:  Recent Labs Lab 01/06/14 0718  AST 50*  ALT 31  ALKPHOS 128*  BILITOT 0.4  PROT 7.2  ALBUMIN 3.7   No results found for this basename: LIPASE, AMYLASE,  in the last 168 hours No results found for this basename: AMMONIA,  in the last 168 hours CBC:  Recent Labs Lab 01/06/14 0718 01/18/2014 1150  WBC 11.2* 13.1*  HGB 13.0 12.5*  HCT  35.8* 34.5*  MCV 87.1 86.5  PLT 125* 119*   Cardiac Enzymes: No results found for this basename: CKTOTAL, CKMB, CKMBINDEX, TROPONINI,  in the last 168 hours  BNP (last 3 results) No results found for this basename: PROBNP,  in the last 8760 hours CBG:  Recent Labs Lab 01/06/14 0743 01/06/14 1043  GLUCAP 115* 125*    Radiological Exams on Admission: Ct Angio Chest Pe W/cm &/or Wo Cm  01/04/2014   CLINICAL DATA:  Left lower lobe mass. Status post bronchoscopy and biopsy 2 days ago. Worsening shortness of breath. Hemoptysis.  EXAM: CT ANGIOGRAPHY CHEST WITH CONTRAST  TECHNIQUE: Multidetector CT imaging of the chest was performed using the standard protocol during bolus administration of intravenous contrast. Multiplanar CT image reconstructions and MIPs were obtained to evaluate the vascular anatomy.  CONTRAST:  70 mL OMNIPAQUE IOHEXOL 350 MG/ML SOLN  COMPARISON:  CT chest 12/31/2013. Chest in two views abdomen earlier this same day.  FINDINGS: There is a moderate right and small left pleural effusion. No pericardial effusion is identified. No pulmonary embolus is identified. The descending left interlobar and ascending right interlobar pulmonary arteries are narrowed by the patient's left lower lobe mass and mediastinal adenopathy.  Bulky mediastinal lymphadenopathy is unchanged. Again seen is a 2.0 cm right paratracheal node on image 28, a 3.1 cm subcarinal node on image 47 and a 2.7 cm left subcarinal lymph node on image 52. 5 cm in diameter left lower lobe mass is again identified. Increased compressive atelectasis is seen in the  bases. There is a new nodular opacity in the right upper lobe measuring 1.1 x 0.8 cm on image 27. Incidentally imaged upper abdomen is unremarkable. No focal bony abnormality is identified.  Review of the MIP images confirms the above findings.  IMPRESSION: Negative for pulmonary embolus. Both the descending left interlobar and ascending right interlobar pulmonary arteries are compressed by lymphadenopathy and the left lower lobe mass.  No change in bulky mediastinal lymphadenopathy and a left lower lobe pulmonary mass.  Some increase in a moderate right and small left pleural effusion.  1.1 cm nodular opacity right upper lobe is new since CT scan 2 weeks ago and indeterminate. Recommend attention on follow-up exams.   Electronically Signed   By: Inge Rise M.D.   On: 01/27/2014 14:33   Dg Abd Acute W/chest  01/26/2014   CLINICAL DATA:  Shortness of breath.  Abdominal pain and distention.  EXAM: ACUTE ABDOMEN SERIES (ABDOMEN 2 VIEW & CHEST 1 VIEW)  COMPARISON:  CT chest 12/31/2013 and PA and lateral chest 01/06/2014.  FINDINGS: Single view of the chest demonstrates bulky mediastinal lymphadenopathy. Left lower lobe mass is seen as on the prior study. There are small bilateral pleural effusions. No pneumothorax.  Two views of the abdomen show no free intraperitoneal air. The stomach appears distended. There is no evidence of small bowel obstruction. Large volume of stool throughout the colon is noted.  IMPRESSION: Left lower lobe mass and mediastinal lymphadenopathy as seen on prior CT. Small bilateral pleural effusions are identified.  Negative for free intraperitoneal air or bowel obstruction.  Gaseous distention of the stomach.  Large volume of stool throughout the colon.   Electronically Signed   By: Inge Rise M.D.   On: 01/28/2014 13:51    EKG: Independently reviewed. A fib HR 160  Assessment/Plan Active Problems:   Melanoma   Left Lower Lobe lung Mass   Shortness of breath   Acute  respiratory failure  Metastatic melanoma to lung   Abdominal distension  1-Acute Respiratory Failure; in setting of metastatic melanoma to lung. Patient with expiratory wheezes. I will start nebulizer treatments, albuterol, ipratropium, one time dose IV solumedrol. Will check ABG, BNP. No evidence of PNA on CT angio, negative for PE. Patient had apneic episode. He is high risk for decompensation. I have consulted CCM. Patient wishes to be Full Code.    2-A fib RVR; HR better controlled on IV Cardizem. Discussed with Dr Servando Snare will hold on starting any anticoagulation for A fib due to recent bronchoscopy and hemoptysis. He also recommend cardiology consultation. Cycle cardiac enzymes.   3-Metastatic Melanoma to lung; Dr Julien Nordmann will be available tomorrow. Please contact Dr Julien Nordmann tomorrow. I will order MRI brain for staging.    4-Abdominal Distension.; Patient notice abdominal distension for last few days. He had 2 small BM yesterday, very hard stool. Will order CT abdomen and pelvis with oral contrast only, he just received IV contrast for CT angio.   5-Diabetes; Hold oral hypoglycemic agents. SSI.  Code Status: Full Code.  Family Communication: care discussed with wife and daughter who were at bedside.  Disposition Plan: expect more than 3 to 4 days.   Time spent: 75 minutes.   Kimber Relic [facility] Pager [phone]

## 2014-01-08 NOTE — ED Notes (Signed)
RT called for a breathing treatment.

## 2014-01-08 NOTE — ED Notes (Signed)
RT at bedside for ABG draw

## 2014-01-08 NOTE — Consult Note (Signed)
PULMONARY  / CRITICAL CARE MEDICINE  Name: Andrew Church MRN: [id number] DOB: [birth year] PCP Simona Huh, MD    ADMISSION DATE:  01/29/2014  LOS 0 days   CONSULTATION DATE:  01/16/2014   REFERRING MD :  Dr Tyrell Antonio PRIMARY SERVICE: [facility]  CHIEF COMPLAINT:  Dyspnea and hemopptysis post lung bx in setting of stage 4 metastatic melanoma  BRIEF PATIENT DESCRIPTION: see HPI  SIGNIFICANT EVENTS / STUDIES:  01/24/2014 - admit  LINES / TUBES:   CULTURES:   ANTIBIOTICS:   HISTORY OF PRESENT ILLNESS:  68 year old functional male with ECOG 1 at baseline, non smoker, hx of Rt groin melanoma 2004 T2A, No, MO 2.102mm depth, Now past several months with progressive dyspnea on exertion to class 3 levels. Evalaution showed bilateral hilar nodes with LLL 4cm mass. S.p EBUS and Tbbx by Dr Servando Snare confirming metastatic melanoma stage 4 on 01/06/14. Admitted 01/26/2014 with class 4 dyspnea, A Fib RVR and post procedural hemoptysis (small amounts). Dyspnea improved after correction of A Fib RVR. PCCM consulted 01/09/2014 due to dyspnea and pulmonary issues. CT chest shows bilateral effusion along with    PAST MEDICAL HISTORY :  Past Medical History  Diagnosis Date  . Neuropathy   . Prediabetes   . HTN (hypertension)   . Hyperlipemia   . Melanoma     a. 04/2003;  b. 01/2014 metastatic to the lung s/p bronch.  . Hx of cardiovascular stress test     a.  Lexiscan Myoview (11/2013): EF 62%, no ischemia; normal study.  . Fatty liver   . Diabetes mellitus     10/2004  . ED (erectile dysfunction)   . Hepatitis B carrier     per [facility]  . CKD (chronic kidney disease), stage III   . PAF (paroxysmal atrial fibrillation)     a. dx 01/2014 in setting of dyspnea and hemoptysis - converted in ER on IV dilt;  b. CHA2DS2VASc = 3 (not anticoagulated 2/2 hemoptysis).     Family History  Problem Relation Age of Onset  . Heart attack Mother   . Emphysema Father      History   Social History  .  Marital Status: Married    Spouse Name: N/A    Number of Children: N/A  . Years of Education: N/A   Occupational History  . Not on file.   Social History Main Topics  . Smoking status: Never Smoker   . Smokeless tobacco: Never Used  . Alcohol Use: No  . Drug Use: No  . Sexual Activity: No   Other Topics Concern  . Not on file   Social History Narrative  . No narrative on file     Allergies  Allergen Reactions  . Penicillins Rash      (Not in an outpatient encounter)     REVIEW OF SYSTEMS:  Per HPI. Otherwise 11 point ROS negative  SUBJECTIVE:   VITAL SIGNS: Filed Vitals:   01/29/2014 1619 01/09/2014 1645 01/10/2014 1715 01/22/2014 1730  BP: 162/72 139/67 172/59 166/58  Pulse: 99 94 94 100  Temp:      Resp:  21 24 23   Height:    5' 10.87" (1.8 m)  Weight:    86.2 kg (190 lb 0.6 oz)  SpO2: 96% 97% 96% 92%      HEMODYNAMICS:   VENTILATOR SETTINGS: Vent Mode:  [-] PRVC FiO2 (%):  [30 %] 30 % Set Rate:  [28 bmp] 28 bmp Vt Set:  [338  mL] 420 mL PEEP:  [5 cmH20] 5 cmH20 Plateau Pressure:  [4 ACZ66-06 cmH20] 20 cmH20 INTAKE / OUTPUT:       PHYSICAL EXAMINATION: General:  Sitting in ER chair. Looks dyspneic Neuro: AxOx3. Speech normal HEENT:  Mallampatti class 2-3 Cardiovascular:  HR 70 in sinus was in  Abdomen:  Obese, soft Musculoskeletal:  No cyanosis, no clubbing, no edema Skin:  intact Psych: Crying.   LABS: PULMONARY  Recent Labs Lab 2014/01/19 1638  PHART 7.432  PCO2ART 27.2*  PO2ART 57.0*  HCO3 18.2*  TCO2 19  O2SAT 91.0    CBC  Recent Labs Lab 01/06/14 0718 Jan 19, 2014 1150  HGB 13.0 12.5*  HCT 35.8* 34.5*  WBC 11.2* 13.1*  PLT 125* 119*    COAGULATION  Recent Labs Lab 01/06/14 0718  INR 1.09    CARDIAC   Recent Labs Lab 01/19/14 1614  TROPONINI <0.30    Recent Labs Lab Jan 19, 2014 1546  PROBNP 305.4*     CHEMISTRY  Recent Labs Lab 01/06/14 0718 January 19, 2014 1150  NA 140 141  K 4.9 4.0  CL 101 102  CO2  22 19  GLUCOSE 133* 235*  BUN 39* 31*  CREATININE 1.31 1.17  CALCIUM 10.4 9.8  MG  --  2.1   Estimated Creatinine Clearance: 63.2 ml/min (by C-G formula based on Cr of 1.17).   LIVER  Recent Labs Lab 01/06/14 0718  AST 50*  ALT 31  ALKPHOS 128*  BILITOT 0.4  PROT 7.2  ALBUMIN 3.7  INR 1.09     INFECTIOUS No results found for this basename: LATICACIDVEN, PROCALCITON,  in the last 168 hours   ENDOCRINE CBG (last 3)   Recent Labs  01/06/14 0743 01/06/14 1043  GLUCAP 115* 125*         IMAGING x48h  Ct Angio Chest Pe W/cm &/or Wo Cm  January 19, 2014   CLINICAL DATA:  Left lower lobe mass. Status post bronchoscopy and biopsy 2 days ago. Worsening shortness of breath. Hemoptysis.  EXAM: CT ANGIOGRAPHY CHEST WITH CONTRAST  TECHNIQUE: Multidetector CT imaging of the chest was performed using the standard protocol during bolus administration of intravenous contrast. Multiplanar CT image reconstructions and MIPs were obtained to evaluate the vascular anatomy.  CONTRAST:  70 mL OMNIPAQUE IOHEXOL 350 MG/ML SOLN  COMPARISON:  CT chest 12/31/2013. Chest in two views abdomen earlier this same day.  FINDINGS: There is a moderate right and small left pleural effusion. No pericardial effusion is identified. No pulmonary embolus is identified. The descending left interlobar and ascending right interlobar pulmonary arteries are narrowed by the patient's left lower lobe mass and mediastinal adenopathy.  Bulky mediastinal lymphadenopathy is unchanged. Again seen is a 2.0 cm right paratracheal node on image 28, a 3.1 cm subcarinal node on image 47 and a 2.7 cm left subcarinal lymph node on image 52. 5 cm in diameter left lower lobe mass is again identified. Increased compressive atelectasis is seen in the bases. There is a new nodular opacity in the right upper lobe measuring 1.1 x 0.8 cm on image 27. Incidentally imaged upper abdomen is unremarkable. No focal bony abnormality is identified.  Review  of the MIP images confirms the above findings.  IMPRESSION: Negative for pulmonary embolus. Both the descending left interlobar and ascending right interlobar pulmonary arteries are compressed by lymphadenopathy and the left lower lobe mass.  No change in bulky mediastinal lymphadenopathy and a left lower lobe pulmonary mass.  Some increase in a moderate right and small  left pleural effusion.  1.1 cm nodular opacity right upper lobe is new since CT scan 2 weeks ago and indeterminate. Recommend attention on follow-up exams.   Electronically Signed   By: Inge Rise M.D.   On: 01/07/2014 14:33   Dg Abd Acute W/chest  01/12/2014   CLINICAL DATA:  Shortness of breath.  Abdominal pain and distention.  EXAM: ACUTE ABDOMEN SERIES (ABDOMEN 2 VIEW & CHEST 1 VIEW)  COMPARISON:  CT chest 12/31/2013 and PA and lateral chest 01/06/2014.  FINDINGS: Single view of the chest demonstrates bulky mediastinal lymphadenopathy. Left lower lobe mass is seen as on the prior study. There are small bilateral pleural effusions. No pneumothorax.  Two views of the abdomen show no free intraperitoneal air. The stomach appears distended. There is no evidence of small bowel obstruction. Large volume of stool throughout the colon is noted.  IMPRESSION: Left lower lobe mass and mediastinal lymphadenopathy as seen on prior CT. Small bilateral pleural effusions are identified.  Negative for free intraperitoneal air or bowel obstruction.  Gaseous distention of the stomach.  Large volume of stool throughout the colon.   Electronically Signed   By: Inge Rise M.D.   On: 01/20/2014 13:51       ASSESSMENT / PLAN:  PULMONARY A: #Post procedureal hemoptysis #Acute resp failure - class 4 dsypnea due to melanoma, effusion, A Fib P:   Symptom relief is goal Nebs Steroids empiric Lasix x 1 Morphine 2mg  IV and then oral ; titrate depending on response (laxatives with morphine)   CARDIOVASCULAR A: A Fib RVR - converted P:  Per  careds  ONcology A:  Metastatic melanoma  - new dx P:   Needs oncology consult Needs concurrent palliative care; recommend palliative care consult   TODAY'S SUMMARY:  He and family of wife and 3 daughters updated. He is in significant spiritual distress.     Dr. Brand Males, M.D., [facility].P Pulmonary and Critical Care Medicine Staff Physician [place] [facility] Pager: [phone], If no answer or between  15:00h - 7:00h: call [phone]  01/16/2014 7:17 PM

## 2014-01-08 NOTE — Progress Notes (Signed)
Dr. Callahan at bedside. 

## 2014-01-08 NOTE — ED Notes (Signed)
Dr. Tyrell Antonio at the bedside.

## 2014-01-08 NOTE — Progress Notes (Signed)
[address]       [name] [address]             [phone]                       Andrew Church [place] Medical Record #[id number] Date of Birth: May 19, 1946  Referring: Gaynelle Arabian, MD Primary Care: Simona Huh, MD  Chief Complaint:    Chief Complaint  Patient presents with  . Lung Mass    Surgical eval on left lower lung mass, Chest CT 12/31/13    History of Present Illness:    Andrew Church 68 y.o. male is seen in the Tuesday for left lung mass. Patient is life long non smoker who presents with increasing chest discomfort and SOB of several month duration but getting worse last 2 weeks. He had upper respiratory symptoms  With cough but no hemoptyses.  Several months ago he was elevated by cardiology for chest discomfort and hypertension, a  with Myoview stress test was done. Chest xray was done leading to CT of the chest. Patient referred for lung mass.   Patient has history of melanoma resected from rt groin 2004 :TNM: pT3a, pN0, pMX Clark IV, 2.5 mm depth  On Wednesday, March 3 the patient went to the operating room for bronchoscopy and ebus/biopsy of the mediastinal nodes and left lower lobe lung lesion more performed. Final pathology today reveals metastatic melanoma BRAF mutation pending. A MRI of the brain and PET scan was arranged and oncology appointment for Monday.   The patient noted increasing shortness of breath today and came to the emergency room. On admission he was found to be in rapid atrial fibrillation with a rate of 170, he was started on IV Cardizem and his now returned to a sinus rhythm. He complains of some abdominal distention and constipation.   Current Activity/ Functional Status:  Patient is independent with mobility/ambulation, transfers, ADL's, IADL's.   Zubrod Score: At the time of surgery this patient's most appropriate activity status/level should be described as: _0     0    Normal activity, no  symptoms _1     1    Restricted in physical strenuous activity but ambulatory, able to do out light work _2     2    Ambulatory and capable of self care, unable to do work activities, up and about               >50 % of waking hours                              _3     3    Only limited self care, in bed greater than 50% of waking hours _4     4    Completely disabled, no self care, confined to bed or chair _5     5    Moribund   Past Medical History  Diagnosis Date  . Neuropathy   . Prediabetes   . HTN (hypertension)   . Hyperlipemia   . Melanoma     a. 04/2003;  b. 01/2014 metastatic to the lung s/p bronch.  . Hx of cardiovascular stress test     a.  Lexiscan Myoview (11/2013): EF 62%, no ischemia; normal study.  . Fatty liver   . Diabetes mellitus     10/2004  . ED (erectile dysfunction)   .  Hepatitis B carrier     per [facility] 2013  . CKD (chronic kidney disease), stage III   . PAF (paroxysmal atrial fibrillation)     a. dx 01/2014 in setting of dyspnea and hemoptysis - converted in ER on IV dilt;  b. CHA2DS2VASc = 3 (not anticoagulated 2/2 hemoptysis).    Past Surgical History  Procedure Laterality Date  . Foot surgery    . Right ankle fracture      12/2005  . Lasik    . Hydrocele excision    . Melanoma excision    . Video bronchoscopy with endobronchial ultrasound N/A 01/06/2014    Procedure: VIDEO BRONCHOSCOPY WITH ENDOBRONCHIAL ULTRASOUND;  Surgeon: Grace Isaac, MD;  Location: [facility] OR;  Service: Thoracic;  Laterality: N/A;  . Bronchial needle aspiration biopsy N/A 01/06/2014    Procedure: NEEDLE BIOPSY;  Surgeon: Grace Isaac, MD;  Location: [facility];  Service: Thoracic;  Laterality: N/A;  . Mediastinoscopy N/A 01/06/2014    Procedure: MEDIASTINOSCOPY;  Surgeon: Grace Isaac, MD;  Location: [facility] OR;  Service: Thoracic;  Laterality: N/A;    Family History  Problem Relation Age of Onset  . Heart attack Mother   . Emphysema and lung cancer  Age 56 Father     History    Social History  . Marital Status: Married    Spouse Name: N/A    Number of Children: N/A  . Years of Education: N/A   Occupational History  . Works full time at [employer] Top[employer]  . Smoking status: Never Smoker   . Smokeless tobacco: Never Used  . Alcohol Use: No  . Drug Use: No  . Sexual Activity: No   Other Topics Concern  . Not on file   Social History Narrative  . No narrative on file    History  Smoking status  . Never Smoker   Smokeless tobacco  . Never Used    History  Alcohol Use No     Allergies  Allergen Reactions  . Penicillins Rash    Current Facility-Administered Medications  Medication Dose Route Frequency Provider Last Rate Last Dose  . diltiazem (CARDIZEM) 1 mg/mL load via infusion 10 mg  10 mg Intravenous Once Threasa Beards, MD      . diltiazem (CARDIZEM) 100 mg in dextrose 5 % 100 mL infusion  5-15 mg/hr Intravenous Titrated Threasa Beards, MD 15 mL/hr at 01/23/2014 1625 15 mg/hr at 02/01/2014 1625  . insulin aspart (novoLOG) injection 0-9 Units  0-9 Units Subcutaneous TID WC Belkys A Harrel Carina, MD      . iohexol (OMNIPAQUE) 300 MG/ML solution 25 mL  25 mL Oral Q1 Hr x 2 Medication Radiologist, MD   25 mL at 01/24/2014 1658  . ipratropium-albuterol (DUONEB) 0.5-2.5 (3) MG/3ML nebulizer solution 3 mL  3 mL Nebulization Q4H Belkys A Harrel Carina, MD   3 mL at 01/29/2014 1535   Current Outpatient Prescriptions  Medication Sig Dispense Refill  . acetaminophen (TYLENOL) 500 MG tablet Take 1,000 mg by mouth daily as needed (neuropathy).       Marland Kitchen amLODipine (NORVASC) 5 MG tablet Take 5 mg by mouth daily.      Marland Kitchen aspirin EC 81 MG tablet Take 81 mg by mouth daily.      . ASTRAGALUS PO Take 470 mg by mouth daily.      Marland Kitchen b complex vitamins tablet Take 1 tablet by mouth daily.      Marland Kitchen  Cinnamon 500 MG capsule Take 500 mg by mouth daily.      . Coenzyme Q10 (CO Q 10 PO) Take 1 tablet by mouth daily.      Marland Kitchen doxycycline (VIBRAMYCIN) 100  MG capsule Take 100 mg by mouth 2 (two) times daily.      Marland Kitchen gabapentin (NEURONTIN) 800 MG tablet Take 800 mg by mouth 3 (three) times daily.      Marland Kitchen gemfibrozil (LOPID) 600 MG tablet Take 600 mg by mouth 2 (two) times daily.      . Ginkgo Biloba 40 MG TABS Take 120 mg by mouth daily.      Marland Kitchen glipiZIDE (GLUCOTROL) 5 MG tablet Take 5 mg by mouth daily before breakfast.       . Glucosamine-Chondroit-Vit C-Mn (GLUCOSAMINE CHONDR 1500 COMPLX) CAPS Take 1 capsule by mouth 2 (two) times daily.      Marland Kitchen ibuprofen (ADVIL,MOTRIN) 200 MG tablet Take 400 mg by mouth 2 (two) times daily as needed (neuropathy).       Marland Kitchen lisinopril (PRINIVIL,ZESTRIL) 20 MG tablet Take 20 mg by mouth daily with supper.       . Milk Thistle 1000 MG CAPS Take 1,000 mg by mouth daily with supper.       . naproxen sodium (ALEVE) 220 MG tablet Take 440 mg by mouth daily as needed (neuropathy).       . Omega-3 Fatty Acids (FISH OIL) 500 MG CAPS Take 500 mg by mouth daily.      . pregabalin (LYRICA) 75 MG capsule Take 1 capsule (75 mg total) by mouth 2 (two) times daily.  60 capsule  6  . Red Yeast Rice 600 MG CAPS Take 600 mg by mouth daily.      Marland Kitchen SALINE NA Place 1 spray into both nostrils daily as needed (congestion).      . traMADol (ULTRAM) 50 MG tablet Take 50-200 mg by mouth every 8 (eight) hours as needed (pain).          Review of Systems:     Cardiac Review of Systems: Y or N  Chest Pain [  y  ]  Resting SOB [  y ] Exertional SOB  [ y ]  Orthopnea [ n ]   Pedal Edema [ n  ]    Palpitations [n  ] Syncope  [ n ]   Presyncope [ n  ]  General Review of Systems: [Y] = yes [  ]=no Constitional: recent weight change Blue.Reese  ];  Wt loss over the last 3 months [ 9 lbs then gained some back  ] anorexia [  ]; fatigue [  ]; nausea [  ]; night sweats [  ]; fever [  ]; or chills [  ];          Dental: poor dentition[  ]; Last Dentist visit:   Eye : blurred vision [  ]; diplopia [   ]; vision changes [  ];  Amaurosis fugax[  ]; Resp: cough [ y  ];  wheezing[y  ];  hemoptysis[ n ]; shortness of breath[ y ]; paroxysmal nocturnal dyspnea[ y ]; dyspnea on exertion[y  ]; or orthopnea[  ];  GI:  gallstones[  ], vomiting[  ];  dysphagia[  ]; melena[  ];  hematochezia [  ]; heartburn[  ];   Hx of  Colonoscopy[ y ]; GU: kidney stones [  ]; hematuria[  ];   dysuria [  ];  nocturia[  ];  history of     obstruction [  ]; urinary frequency [  ]             Skin: rash, swelling[  ];, hair loss[  ];  peripheral edema[  ];  or itching[  ]; Musculosketetal: myalgias[  ];  joint swelling[  ];  joint erythema[  ];  joint pain[  ];  back pain[  ];  Heme/Lymph: bruising[  ];  bleeding[  ];  anemia[  ];  Neuro: TIA[  ];  headaches[  ];  stroke[  ];  vertigo[  ];  seizures[  ];   Paresthesias[neuropath in feet severe  ];  difficulty walking[  ];  Psych:depression[  ]; anxiety[  ];  Endocrine: diabetes[n  ];  thyroid dysfunction[  ];  Immunizations: Flu up to date [ y ]; Pneumococcal up to date Blue.Reese  ];  Other:  Physical Exam: BP 172/59  Pulse 94  Temp(Src) 98.6 F (37 C)  Resp 24  SpO2 96%  PHYSICAL EXAMINATION:  General appearance: alert, cooperative, appears stated age and mild distress Neurologic: intact Heart: regular rate and rhythm, S1, S2 normal, no murmur, click, rub or gallop Lungs: clear to auscultation bilaterally Abdomen: soft, non-tender; bowel sounds normal; no masses,  no organomegaly Extremities: extremities normal, atraumatic, no cyanosis or edema and Homans sign is negative, no sign of DVT Rt groin clear of palpable masses, no other palpable adenopathy, rt supraclavicular fossa full but no palpable mass  Diagnostic Studies & Laboratory data:     Recent Radiology Findings:  Ct Angio Chest Pe W/cm &/or Wo Cm  01/20/2014   CLINICAL DATA:  Left lower lobe mass. Status post bronchoscopy and biopsy 2 days ago. Worsening shortness of breath. Hemoptysis.  EXAM: CT ANGIOGRAPHY CHEST WITH CONTRAST  TECHNIQUE: Multidetector CT imaging of  the chest was performed using the standard protocol during bolus administration of intravenous contrast. Multiplanar CT image reconstructions and MIPs were obtained to evaluate the vascular anatomy.  CONTRAST:  70 mL OMNIPAQUE IOHEXOL 350 MG/ML SOLN  COMPARISON:  CT chest 12/31/2013. Chest in two views abdomen earlier this same day.  FINDINGS: There is a moderate right and small left pleural effusion. No pericardial effusion is identified. No pulmonary embolus is identified. The descending left interlobar and ascending right interlobar pulmonary arteries are narrowed by the patient's left lower lobe mass and mediastinal adenopathy.  Bulky mediastinal lymphadenopathy is unchanged. Again seen is a 2.0 cm right paratracheal node on image 28, a 3.1 cm subcarinal node on image 47 and a 2.7 cm left subcarinal lymph node on image 52. 5 cm in diameter left lower lobe mass is again identified. Increased compressive atelectasis is seen in the bases. There is a new nodular opacity in the right upper lobe measuring 1.1 x 0.8 cm on image 27. Incidentally imaged upper abdomen is unremarkable. No focal bony abnormality is identified.  Review of the MIP images confirms the above findings.  IMPRESSION: Negative for pulmonary embolus. Both the descending left interlobar and ascending right interlobar pulmonary arteries are compressed by lymphadenopathy and the left lower lobe mass.  No change in bulky mediastinal lymphadenopathy and a left lower lobe pulmonary mass.  Some increase in a moderate right and small left pleural effusion.  1.1 cm nodular opacity right upper lobe is new since CT scan 2 weeks ago and indeterminate. Recommend attention on follow-up exams.   Electronically Signed   By: Inge Rise M.D.   On: 01/07/2014 14:33  Dg Abd Acute W/chest  01/31/2014   CLINICAL DATA:  Shortness of breath.  Abdominal pain and distention.  EXAM: ACUTE ABDOMEN SERIES (ABDOMEN 2 VIEW & CHEST 1 VIEW)  COMPARISON:  CT chest  12/31/2013 and PA and lateral chest 01/06/2014.  FINDINGS: Single view of the chest demonstrates bulky mediastinal lymphadenopathy. Left lower lobe mass is seen as on the prior study. There are small bilateral pleural effusions. No pneumothorax.  Two views of the abdomen show no free intraperitoneal air. The stomach appears distended. There is no evidence of small bowel obstruction. Large volume of stool throughout the colon is noted.  IMPRESSION: Left lower lobe mass and mediastinal lymphadenopathy as seen on prior CT. Small bilateral pleural effusions are identified.  Negative for free intraperitoneal air or bowel obstruction.  Gaseous distention of the stomach.  Large volume of stool throughout the colon.   Electronically Signed   By: Inge Rise M.D.   On: 01/13/2014 13:51    Dg Chest 2 View  12/29/2013   CLINICAL DATA:  Cough, congestion, shortness of breath for 2 weeks, evaluate for pneumonia  EXAM: CHEST  2 VIEW  COMPARISON:  DG CHEST 1V PORT dated 09/03/2013; DG CHEST 2 VIEW dated 06/05/2012; DG CHEST 2 VIEW dated 06/07/2011  FINDINGS: Grossly unchanged borderline enlarged cardiac silhouette. Interval development of an approximately 4.9 x 4.2 cm apparent mass within the superior segment of the left lower lobe, best appreciated on the provided lateral radiograph. This finding is associated with apparent nodular thickening of the contralateral right paratracheal stripe. The lungs remain hyperexpanded with flattening of bilateral hemidiaphragms and mild diffuse slightly nodular thickening of the pulmonary interstitium. Interval development of trace bilateral pleural effusions. Worsening left basilar heterogeneous/consolidative opacities. No evidence of edema. No pneumothorax. Grossly unchanged bones.  IMPRESSION: 1. Interval development of an approximately 4.9 cm mass within the superior segment left lower lobe with possible adenopathy within in the contralateral right mediastinum. Further evaluation with  contrast-enhanced chest CT is recommended. 2. Worsening left basilar/retrocardiac opacities, atelectasis versus infiltrate. 3. Interval development of trace bilateral pleural effusions. No evidence of edema. These results will be called to the ordering clinician or representative by the Radiologist Assistant, and communication documented in the PACS Dashboard.   Electronically Signed   By: Sandi Mariscal M.D.   On: 12/29/2013 16:56   Ct Chest W Contrast  12/31/2013   CLINICAL DATA:  Abnormal chest radiograph with possible adenopathy, chest pain, shortness of breath.  EXAM: CT CHEST WITH CONTRAST  TECHNIQUE: Multidetector CT imaging of the chest was performed during intravenous contrast administration.  CONTRAST:  16mL OMNIPAQUE IOHEXOL 300 MG/ML  SOLN  COMPARISON:  Chest radiographs dated 12/29/2013.  FINDINGS: 5.0 x 4.4 cm central left lower lobe mass (series 3/image 39), suspicious for primary bronchogenic neoplasm. Mass extends into the left hilar region (series 2/ image 34). Narrowing of the left lower lobe bronchus.  Small right and trace left pleural effusions.  No pneumothorax.  Visualized thyroid is unremarkable.  The heart is normal in size. No pericardial effusion. Mild coronary atherosclerosis in the LAD. No evidence of thoracic aortic aneurysm.  Associated mediastinal lymphadenopathy, including:  --8 mm short axis right supraclavicular node (series 2/image 9)  --20 mm short axis high right paratracheal node (series 2/image 21)  --13 mm short axis right hilar node (series 2/image 32)  --31 mm short axis subcarinal node (series 2/image 34)  Visualized upper abdomen is unremarkable. Bilateral adrenal glands are within normal limits.  Degenerative  changes of the visualized thoracolumbar spine. Probable bone island at T12 (sagittal image 79).  IMPRESSION: 5.0 cm central left lower lobe mass, suspicious for primary bronchogenic neoplasm.  Associated thoracic lymphadenopathy, as described above, including a  prominent 8 mm short axis right supraclavicular node.  Small right and trace left pleural effusions.  For tissue diagnosis, consider bronchoscopy or percutaneous sampling of the right supraclavicular node.  These results will be called to the ordering clinician or representative by the Radiologist Assistant, and communication documented in the PACS Dashboard.   Electronically Signed   By: Julian Hy M.D.   On: 12/31/2013 14:36      Recent Lab Findings: Lab Results  Component Value Date   WBC 13.1* 01/14/2014   HGB 12.5* 01/13/2014   HCT 34.5* 01/10/2014   PLT 119* 01/19/2014   GLUCOSE 235* 01/24/2014   ALT 31 01/06/2014   AST 50* 01/06/2014   NA 141 01/05/2014   K 4.0 01/12/2014   CL 102 01/16/2014   CREATININE 1.17 01/16/2014   BUN 31* 01/25/2014   CO2 19 01/04/2014   INR 1.09 01/06/2014   Path: Lung, biopsy, Left lower lobe - METASTATIC MALIGNANT MELANOMA, SEE COMMENT. Microscopic Comment The previous history of right groin malignant melanoma is noted (pT2a) ([id number]). The current lung biopsy demonstrates extensive involvement by high grade poorly differentiated neoplasm with the following immunophenotype. Cytokeratin AE1/3 - negative expression CD45 - negative expression. Chromogranin - negative expression. CD56 - diffuse weak expression. Melan-A - strong diffuse expression. MART-1 - patchy weak to moderate expression. Overall, the morphology and immunophenotype are that of metastatic malignant melanoma. The case was reviewed with Dr. Lyndon Code who concurs. The case was discussed with Dr. Servando Snare on 01/18/2014. The specimen will be submitted for BRAF mutational analysis. (CRR:gt, 01/22/2014) [place]i RUND DO     PATH:Date Received: 05/04/2003 FINAL DIAGNOSIS MICROSCOPIC EXAMINATION AND DIAGNOSIS RIGHT GROIN SKIN AND SENTINEL LYMPH NODES, WIDE EXCISION: MALIGNANT MELANOMA. SEE TABLE. THREE SENTINEL LYMPH NODES: MELANOCYTES WITHIN LYMPH NODE CAPSULE. SEE COMMENT.  MELANOMA TABLE- Type: Superficial  spreading Clark' s Level: IV Breslow' s measurement: 2.5 mm Host response: Slight Mitosis: Not numerous Regression: Not seen Ulceration: Not seen Satellitosis: Not seen Vascular Invasion: See comment Margin: Free of tumor Lymph nodes: # examined 3; # positive 0 TNM: pT3a, pN0, pMX Comment: At the base of the tumor within the dermis there are several nests of tumor with clear spaces around the nests consistent with tissue shrinkage artifact rather than vascular space involvement by tumor.  Within the capsule of one of the sentinel lymph nodes (Slides C and D) there are nests of melanocytic cells with minimal cytologic atypia. These cells stain positive with immunoperoxidase for Melan-A but are negative for HMB45 and S100. These cells have significantly less cytologic atypia than the melanoma and the features favor nevus cells within the lymph node capsule. The remaining two nodes show no metastatic melanoma with H immunoperoxidase. All the immunoperoxidase controls stained appropriately. Case discussed with Dr. Rebekah Chesterfield on 05/07/03. (JDP:caf 05/07/03)  cf Date Reported: 05/07/2003 Chrystie Nose. Saralyn Pilar, MD  Electronically Signed Out By JDP    Assessment / Plan:   Left lung mass with extensive mediastinal adenopathy, with symptomatic sob and now rapid AF- now confirmed to be  Metastatic  melanoma, BRAF  pending, MRI brain pending, Oncology to see.  With recent bronchoscopy the biopsy would avoid anticoagulation now.    Grace Isaac MD  [address] [place] [address] Office [phone]  Beeper [phone]  01/16/2014 5:34 PM

## 2014-01-09 ENCOUNTER — Encounter (HOSPITAL_COMMUNITY): Payer: Self-pay | Admitting: *Deleted

## 2014-01-09 ENCOUNTER — Inpatient Hospital Stay (HOSPITAL_COMMUNITY): Payer: BC Managed Care – PPO

## 2014-01-09 ENCOUNTER — Other Ambulatory Visit (HOSPITAL_COMMUNITY): Payer: BC Managed Care – PPO

## 2014-01-09 DIAGNOSIS — I4891 Unspecified atrial fibrillation: Secondary | ICD-10-CM | POA: Diagnosis present

## 2014-01-09 DIAGNOSIS — E873 Alkalosis: Secondary | ICD-10-CM | POA: Clinically undetermined

## 2014-01-09 DIAGNOSIS — R55 Syncope and collapse: Secondary | ICD-10-CM | POA: Diagnosis present

## 2014-01-09 DIAGNOSIS — A419 Sepsis, unspecified organism: Secondary | ICD-10-CM | POA: Diagnosis present

## 2014-01-09 DIAGNOSIS — E872 Acidosis, unspecified: Secondary | ICD-10-CM | POA: Diagnosis present

## 2014-01-09 DIAGNOSIS — R079 Chest pain, unspecified: Secondary | ICD-10-CM

## 2014-01-09 DIAGNOSIS — K5900 Constipation, unspecified: Secondary | ICD-10-CM | POA: Diagnosis present

## 2014-01-09 LAB — URINALYSIS, ROUTINE W REFLEX MICROSCOPIC
Bilirubin Urine: NEGATIVE
Glucose, UA: NEGATIVE mg/dL
HGB URINE DIPSTICK: NEGATIVE
Ketones, ur: NEGATIVE mg/dL
Leukocytes, UA: NEGATIVE
NITRITE: NEGATIVE
PH: 5 (ref 5.0–8.0)
Protein, ur: NEGATIVE mg/dL
SPECIFIC GRAVITY, URINE: 1.017 (ref 1.005–1.030)
Urobilinogen, UA: 0.2 mg/dL (ref 0.0–1.0)

## 2014-01-09 LAB — TROPONIN I: Troponin I: <0.3 ng/mL (ref <0.30)

## 2014-01-09 LAB — PRO B NATRIURETIC PEPTIDE: PRO B NATRI PEPTIDE: 1296 pg/mL — AB (ref 0–125)

## 2014-01-09 LAB — BASIC METABOLIC PANEL
BUN: 31 mg/dL — ABNORMAL HIGH (ref 6–23)
CHLORIDE: 96 meq/L (ref 96–112)
CO2: 16 mEq/L — ABNORMAL LOW (ref 19–32)
CREATININE: 1.29 mg/dL (ref 0.50–1.35)
Calcium: 9.6 mg/dL (ref 8.4–10.5)
GFR, EST AFRICAN AMERICAN: 64 mL/min — AB (ref >90)
GFR, EST NON AFRICAN AMERICAN: 55 mL/min — AB (ref >90)
Glucose, Bld: 274 mg/dL — ABNORMAL HIGH (ref 70–99)
POTASSIUM: 4.5 meq/L (ref 3.7–5.3)
Sodium: 137 mEq/L (ref 137–147)

## 2014-01-09 LAB — CBC
HEMATOCRIT: 32.4 % — AB (ref 39.0–52.0)
Hemoglobin: 11.7 g/dL — ABNORMAL LOW (ref 13.0–17.0)
MCH: 31.1 pg (ref 26.0–34.0)
MCHC: 36.1 g/dL — ABNORMAL HIGH (ref 30.0–36.0)
MCV: 86.2 fL (ref 78.0–100.0)
Platelets: 110 10*3/uL — ABNORMAL LOW (ref 150–400)
RBC: 3.76 MIL/uL — ABNORMAL LOW (ref 4.22–5.81)
RDW: 12.8 % (ref 11.5–15.5)
WBC: 15.8 10*3/uL — ABNORMAL HIGH (ref 4.0–10.5)

## 2014-01-09 LAB — GLUCOSE, CAPILLARY
GLUCOSE-CAPILLARY: 243 mg/dL — AB (ref 70–99)
GLUCOSE-CAPILLARY: 194 mg/dL — AB (ref 70–99)
GLUCOSE-CAPILLARY: 314 mg/dL — AB (ref 70–99)
Glucose-Capillary: 247 mg/dL — ABNORMAL HIGH (ref 70–99)
Glucose-Capillary: 307 mg/dL — ABNORMAL HIGH (ref 70–99)

## 2014-01-09 LAB — LACTIC ACID, PLASMA
LACTIC ACID, VENOUS: 7.5 mmol/L — AB (ref 0.5–2.2)
LACTIC ACID, VENOUS: 6.6 mmol/L — AB (ref 0.5–2.2)

## 2014-01-09 LAB — PROCALCITONIN: PROCALCITONIN: 0.13 ng/mL

## 2014-01-09 MED ORDER — PIPERACILLIN-TAZOBACTAM 3.375 G IVPB
3.3750 g | Freq: Three times a day (TID) | INTRAVENOUS | Status: DC
  Administered 2014-01-09 – 2014-01-13 (×12): 3.375 g via INTRAVENOUS
  Filled 2014-01-09 (×16): qty 50

## 2014-01-09 MED ORDER — INSULIN ASPART 100 UNIT/ML ~~LOC~~ SOLN
0.0000 [IU] | Freq: Three times a day (TID) | SUBCUTANEOUS | Status: DC
  Administered 2014-01-10: 4 [IU] via SUBCUTANEOUS
  Administered 2014-01-10: 7 [IU] via SUBCUTANEOUS
  Administered 2014-01-10: 4 [IU] via SUBCUTANEOUS
  Administered 2014-01-11: 7 [IU] via SUBCUTANEOUS
  Administered 2014-01-11 (×2): 4 [IU] via SUBCUTANEOUS
  Administered 2014-01-12: 3 [IU] via SUBCUTANEOUS

## 2014-01-09 MED ORDER — INSULIN GLARGINE 100 UNIT/ML ~~LOC~~ SOLN
6.0000 [IU] | Freq: Every day | SUBCUTANEOUS | Status: DC
  Administered 2014-01-09 – 2014-01-11 (×3): 6 [IU] via SUBCUTANEOUS
  Filled 2014-01-09 (×4): qty 0.06

## 2014-01-09 MED ORDER — FLEET ENEMA 7-19 GM/118ML RE ENEM
1.0000 | ENEMA | Freq: Once | RECTAL | Status: AC
  Administered 2014-01-09: 1 via RECTAL
  Filled 2014-01-09: qty 1

## 2014-01-09 MED ORDER — INSULIN ASPART 100 UNIT/ML ~~LOC~~ SOLN
0.0000 [IU] | Freq: Every day | SUBCUTANEOUS | Status: DC
  Administered 2014-01-09: 4 [IU] via SUBCUTANEOUS
  Administered 2014-01-10: 3 [IU] via SUBCUTANEOUS

## 2014-01-09 MED ORDER — SALINE SPRAY 0.65 % NA SOLN
1.0000 | NASAL | Status: DC | PRN
  Administered 2014-01-10: 1 via NASAL
  Filled 2014-01-09: qty 44

## 2014-01-09 MED ORDER — DILTIAZEM HCL ER 90 MG PO CP12
90.0000 mg | ORAL_CAPSULE | Freq: Two times a day (BID) | ORAL | Status: DC
  Administered 2014-01-09 – 2014-01-10 (×4): 90 mg via ORAL
  Filled 2014-01-09 (×7): qty 1

## 2014-01-09 NOTE — Progress Notes (Signed)
ANTIBIOTIC CONSULT NOTE - INITIAL  Pharmacy Consult for Zosyn Indication: rule out pneumonia  Allergies  Allergen Reactions  . Penicillins Rash    Patient Measurements: Height: 5' 10.87" (180 cm) Weight: 190 lb 0.6 oz (86.2 kg) IBW/kg (Calculated) : 74.99  Vital Signs: Temp: 98.3 F (36.8 C) (03/07 1223) Temp src: Oral (03/07 1223) BP: 149/63 mmHg (03/07 1223) Pulse Rate: 102 (03/07 1223) Intake/Output from previous day: 03/06 0701 - 03/07 0700 In: -  Out: 400 [Urine:400] Intake/Output from this shift: Total I/O In: 515 [P.O.:500; I.V.:15] Out: -   Labs:  Recent Labs  01/28/2014 1150 01/09/14 0247  WBC 13.1* 15.8*  HGB 12.5* 11.7*  PLT 119* 110*  CREATININE 1.17 1.29   Estimated Creatinine Clearance: 57.3 ml/min (by C-G formula based on Cr of 1.29). No results found for this basename: VANCOTROUGH, Corlis Leak, VANCORANDOM, [place], GENTPEAK, GENTRANDOM, TOBRATROUGH, TOBRAPEAK, TOBRARND, AMIKACINPEAK, AMIKACINTROU, AMIKACIN,  in the last 72 hours   Microbiology: Recent Results (from the past 720 hour(s))  MRSA PCR SCREENING     Status: None   Collection Time    01/05/2014  8:50 PM      Result Value Ref Range Status   MRSA by PCR NEGATIVE  NEGATIVE Final   Comment:            The GeneXpert MRSA Assay (FDA     approved for NASAL specimens     only), is one component of a     comprehensive MRSA colonization     surveillance program. It is not     intended to diagnose MRSA     infection nor to guide or     monitor treatment for     MRSA infections.    Medical History: Past Medical History  Diagnosis Date  . Neuropathy   . Prediabetes   . HTN (hypertension)   . Hyperlipemia   . Melanoma     a. 04/2003;  b. 01/2014 metastatic to the lung s/p bronch.  . Hx of cardiovascular stress test     a.  Lexiscan Myoview (11/2013): EF 62%, no ischemia; normal study.  . Fatty liver   . Diabetes mellitus     10/2004  . ED (erectile dysfunction)   . Hepatitis B  carrier     per [facility] 2013  . CKD (chronic kidney disease), stage III   . PAF (paroxysmal atrial fibrillation)     a. dx 01/2014 in setting of dyspnea and hemoptysis - converted in ER on IV dilt;  b. CHA2DS2VASc = 3 (not anticoagulated 2/2 hemoptysis).    Medications:  Scheduled:  . diltiazem  90 mg Oral Q12H  . diltiazem  10 mg Intravenous Once  . docusate sodium  100 mg Oral BID  . gabapentin  800 mg Oral TID  . gemfibrozil  600 mg Oral BID AC  . insulin aspart  0-9 Units Subcutaneous TID WC  . ipratropium-albuterol  3 mL Nebulization Q4H  . methylPREDNISolone (SOLU-MEDROL) injection  60 mg Intravenous 3 times per day  . morphine  2.5 mg Oral Q4H  . piperacillin-tazobactam (ZOSYN)  IV  3.375 g Intravenous 3 times per day  . polyethylene glycol  17 g Oral Daily  . pregabalin  75 mg Oral BID  . senna-docusate  2 tablet Oral QHS  . sodium chloride  3 mL Intravenous Q12H   Infusions:  . diltiazem (CARDIZEM) infusion 5 mg/hr (01/09/14 0630)   Assessment: 68 yo M presenting on 3/6 with complaints of worsening  dyspnea. Had 2 episodes in ED where became apneic and syncopal, EKG showing VT. Pharmacy consulted to start Zosyn for possible aspiration PNA. Patient noted to have slight rash in the past with penicillins, nurse notified to contact MD if rash occurs.  Patient is currently afebrile, WBC elevated to 15.8, LA elevated at 7.5. SCr 1.29 (CrCl ~57 ml/min).  Zosyn 3/7>>  3/7 BCx2>>  Goal of Therapy:  Resolution of infection  Plan:  - Start Zosyn 3.375 gm IV q8h (infuse over 4 hours) - Monitor renal function, C&S, temp, WBC  Harolyn Rutherford, PharmD Clinical Pharmacist - Resident Pager: [phone] Pharmacy: [phone] 01/09/2014 12:47 PM

## 2014-01-09 NOTE — Progress Notes (Signed)
      [address]       [place] [address]             [phone]     CARDIOTHORACIC SURGERY PROGRESS NOTE   Subjective: Reports breathing has improved a little.  No other complaints.  Objective: Vital signs in last 24 hours: Temp:  [98.2 F (36.8 C)-99.6 F (37.6 C)] 98.3 F (36.8 C) (03/07 1223) Pulse Rate:  [84-104] 102 (03/07 1223) Cardiac Rhythm:  [-] Atrial fibrillation (03/07 0837) Resp:  [9-26] 16 (03/07 0721) BP: (114-206)/(53-93) 149/63 mmHg (03/07 1223) SpO2:  [92 %-99 %] 95 % (03/07 1223) Weight:  [86.2 kg (190 lb 0.6 oz)] 86.2 kg (190 lb 0.6 oz) (03/06 1730)  Physical Exam:  Rhythm:   Afib w/ HR 90-100  Breath sounds: Few insp crackles  Heart sounds:  irreg  Incisions:  Clean and dry  Abdomen:  soft  Extremities:  warm   Intake/Output from previous day: 03/06 0701 - 03/07 0700 In: -  Out: 400 [Urine:400] Intake/Output this shift: Total I/O In: 515 [P.O.:500; I.V.:15] Out: -   Lab Results:  Recent Labs  01/14/2014 1150 01/09/14 0247  WBC 13.1* 15.8*  HGB 12.5* 11.7*  HCT 34.5* 32.4*  PLT 119* 110*   BMET:  Recent Labs  01/11/2014 1150 01/09/14 0247  NA 141 137  K 4.0 4.5  CL 102 96  CO2 19 16*  GLUCOSE 235* 274*  BUN 31* 31*  CREATININE 1.17 1.29  CALCIUM 9.8 9.6    CBG (last 3)   Recent Labs  01/27/2014 2201 01/09/14 0720 01/09/14 1224  GLUCAP 201* 243* 194*   PT/INR:  No results found for this basename: LABPROT, INR,  in the last 72 hours  CXR:  PORTABLE CHEST - 1 VIEW  COMPARISON: 01/20/2014  FINDINGS:  Chest radiograph demonstrates persistent densities in the medial  left lower lung. Findings are compatible with volume loss and  consolidation. Heart size is grossly stable. The trachea is midline.  Again noted is widening of the mediastinum related to the known  lymphadenopathy.  IMPRESSION:  Persistent consolidation in the medial left lower lung.  Mediastinal lymphadenopathy.  Electronically Signed    By: Markus Daft M.D.  On: 01/09/2014 14:35   Assessment/Plan:  I am not impressed by the size of the small bilateral pleural effusions and question the need for thoracentesis.  Will defer management to the medical teams.  Ameerah Huffstetler H 01/09/2014 2:43 PM

## 2014-01-09 NOTE — Progress Notes (Signed)
[facility] PROGRESS NOTE  LONZIE SIMMER ZOX:[id number] DOB: 1946/10/09 DOA: 01/30/2014 PCP: Simona Huh, MD  Assessment/Plan: Active Problems:    Left Lower Lobe lung Mass: Secondary to metastatic melanoma   Shortness of breath: Secondary bilateral pleural effusions. Patient needs therapeutic thoracentesis as well as diagnostic to rule out infection.    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 In the interim, checking MRI of brain to look for metastases. On IV steroids.    Abdominal distension: Secondary to constipation.   Unspecified constipation: Have started Colace and will give Fleet enema.    Syncope: Initial episode in emergency room and then again last night. Concerning for possibility of brain metastases. For MRI, unsure if he can tolerate lying down flat on his back in the MRI scanner    Metabolic acidosis: Secondary lactic acidosis.  Worse today. Unclear why. Cranial care following.   Metabolic alkalosis   WJXBJY(782.95): Unclear. Checking UA. Discussed with critical care. Anion gap today is up to 25 lactic acid level of 7. Could be bacterial translocation of the gut from constipation, although patient clinically looks okay. Blood cultures done and Zosyn started prophylactically. Could possibly be postobstructive pneumonia?    Atrial fibrillation with RVR: Rate controlled on Cardizem. Have started by mouth Cardizem with plans to wean off drip.   Code Status: Full code  Family Communication: Wife at the bedside  Disposition Plan: Continue in stepdown until sepsis resolves   Consultants:  Cardiology  Pulmonary/CCM  Procedures:  MRI pending    Antibiotics:  IV Zosyn 3/7-present  HPI/Subjective: Pt feeling quite fatigued, chronic (for the last month) SOB, continued constipation.  Overwhelmed   Objective: Filed Vitals:   01/09/14 1223  BP: 149/63  Pulse: 102  Temp: 98.3 F  (36.8 C)  Resp:     Intake/Output Summary (Last 24 hours) at 01/09/14 1351 Last data filed at 01/09/14 0900  Gross per 24 hour  Intake    515 ml  Output    400 ml  Net    115 ml   Filed Weights   01/23/2014 1730  Weight: 86.2 kg (190 lb 0.6 oz)    Exam:   General:  A&O x3, tearful, mild dyspneic  Cardiovascular: RRR S1S2, borderline tachycardia  Respiratory: Decreasedbreath sounds bibasilar  Abdomen: soft, distended, NT, hypoactive bowel sounds  Musculoskeletal: Trace pitting edema bilaterally   Data Reviewed: Basic Metabolic Panel:  Recent Labs Lab 01/06/14 0718 01/20/2014 1150 01/09/14 0247  NA 140 141 137  K 4.9 4.0 4.5  CL 101 102 96  CO2 22 19 16*  GLUCOSE 133* 235* 274*  BUN 39* 31* 31*  CREATININE 1.31 1.17 1.29  CALCIUM 10.4 9.8 9.6  MG  --  2.1  --    Liver Function Tests:  Recent Labs Lab 01/06/14 0718  AST 50*  ALT 31  ALKPHOS 128*  BILITOT 0.4  PROT 7.2  ALBUMIN 3.7   CBC:  Recent Labs Lab 01/06/14 0718 01/14/2014 1150 01/09/14 0247  WBC 11.2* 13.1* 15.8*  HGB 13.0 12.5* 11.7*  HCT 35.8* 34.5* 32.4*  MCV 87.1 86.5 86.2  PLT 125* 119* 110*   Cardiac Enzymes:  Recent Labs Lab 01/29/2014 1614 01/12/2014 2236 01/09/14 0247  TROPONINI <0.30 <0.30 <0.30   BNP (last 3 results)  Recent Labs  02/02/2014 1546 01/09/14 0248  PROBNP 305.4* 1296.0*   CBG:  Recent Labs Lab 01/06/14 0743 01/06/14 1043 01/12/2014 2201 01/09/14 0720  01/09/14 1224  GLUCAP 115* 125* 201* 243* 194*    Recent Results (from the past 240 hour(s))  MRSA PCR SCREENING     Status: None   Collection Time    01/30/2014  8:50 PM      Result Value Ref Range Status   MRSA by PCR NEGATIVE  NEGATIVE Final   Comment:            The GeneXpert MRSA Assay (FDA     approved for NASAL specimens     only), is one component of a     comprehensive MRSA colonization     surveillance program. It is not     intended to diagnose MRSA     infection nor to guide or      monitor treatment for     MRSA infections.     Studies: Ct Abdomen Pelvis Wo Contrast  01/28/2014   CLINICAL DATA:  Constipation. Hepatitis B. 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  EXAM: CT ABDOMEN AND PELVIS WITHOUT CONTRAST  TECHNIQUE: Multidetector CT imaging of the abdomen and pelvis was performed following the standard protocol without intravenous contrast.  COMPARISON:  DG ABD ACUTE W/CHEST dated 01/14/2014  FINDINGS: Unchanged chest findings of mediastinal adenopathy, bilateral pleural effusions, and left lower lobe mass. Hepatic steatosis. Normal spleen, gallbladder, pancreas, and kidneys.  8 x 16 mm left adrenal nodularity, nonspecific, but changed from 2007, not visibly hypervascular, metastasis versus small adenoma (favored) are considerations.  Normal kidneys ureters, and bladder. No retroperitoneal adenopathy. No bowel obstruction or bowel wall thickening. Moderate gastric distention of a gaseous nature, but no visible outlet obstruction. Moderate stool burden. No osseous findings. Chronic bone island in T12. Superficial soft tissues unremarkable. No pathologically enlarged inguinal adenopathy or hernia. No appendiceal inflammation. Mild prostate enlargement.  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  8 x 16 mm left adrenal nodularity, nonspecific, but interval change since 2007.  No acute intra-abdominal findings.   Electronically Signed   By: Rolla Flatten M.D.   On: 01/16/2014 20:23   Ct Angio Chest Pe W/cm &/or Wo Cm  01/30/2014   CLINICAL DATA:  Left lower lobe mass. Status post bronchoscopy and biopsy 2 days ago. Worsening shortness of breath. Hemoptysis.  EXAM: CT ANGIOGRAPHY CHEST WITH CONTRAST  TECHNIQUE: Multidetector CT imaging of the chest was performed using the standard protocol during bolus administration of intravenous contrast. Multiplanar CT image reconstructions and MIPs were obtained to evaluate the vascular anatomy.   CONTRAST:  70 mL OMNIPAQUE IOHEXOL 350 MG/ML SOLN  COMPARISON:  CT chest 12/31/2013. Chest in two views abdomen earlier this same day.  FINDINGS: There is a moderate right and small left pleural effusion. No pericardial effusion is identified. No pulmonary embolus is identified. The descending left interlobar and ascending right interlobar pulmonary arteries are narrowed by the patient's left lower lobe mass and mediastinal adenopathy.  Bulky mediastinal lymphadenopathy is unchanged. Again seen is a 2.0 cm right paratracheal node on image 28, a 3.1 cm subcarinal node on image 47 and a 2.7 cm left subcarinal lymph node on image 52. 5 cm in diameter left lower lobe mass is again identified. Increased compressive atelectasis is seen in the bases. There is a new nodular opacity in the right upper lobe measuring 1.1 x 0.8 cm on image 27. Incidentally imaged upper abdomen is unremarkable. No focal bony abnormality is identified.  Review of the MIP images confirms the above findings.  IMPRESSION: Negative for  pulmonary embolus. Both the descending left interlobar and ascending right interlobar pulmonary arteries are compressed by lymphadenopathy and the left lower lobe mass.  No change in bulky mediastinal lymphadenopathy and a left lower lobe pulmonary mass.  Some increase in a moderate right and small left pleural effusion.  1.1 cm nodular opacity right upper lobe is new since CT scan 2 weeks ago and indeterminate. Recommend attention on follow-up exams.   Electronically Signed   By: Inge Rise M.D.   On: 01/07/2014 14:33   Dg Abd Acute W/chest  01/04/2014   CLINICAL DATA:  Shortness of breath.  Abdominal pain and distention.  EXAM: ACUTE ABDOMEN SERIES (ABDOMEN 2 VIEW & CHEST 1 VIEW)  COMPARISON:  CT chest 12/31/2013 and PA and lateral chest 01/06/2014.  FINDINGS: Single view of the chest demonstrates bulky mediastinal lymphadenopathy. Left lower lobe mass is seen as on the prior study. There are small bilateral  pleural effusions. No pneumothorax.  Two views of the abdomen show no free intraperitoneal air. The stomach appears distended. There is no evidence of small bowel obstruction. Large volume of stool throughout the colon is noted.  IMPRESSION: Left lower lobe mass and mediastinal lymphadenopathy as seen on prior CT. Small bilateral pleural effusions are identified.  Negative for free intraperitoneal air or bowel obstruction.  Gaseous distention of the stomach.  Large volume of stool throughout the colon.   Electronically Signed   By: Inge Rise M.D.   On: 01/03/2014 13:51    Scheduled Meds: . diltiazem  90 mg Oral Q12H  . diltiazem  10 mg Intravenous Once  . docusate sodium  100 mg Oral BID  . gabapentin  800 mg Oral TID  . gemfibrozil  600 mg Oral BID AC  . insulin aspart  0-9 Units Subcutaneous TID WC  . ipratropium-albuterol  3 mL Nebulization Q4H  . methylPREDNISolone (SOLU-MEDROL) injection  60 mg Intravenous 3 times per day  . morphine  2.5 mg Oral Q4H  . piperacillin-tazobactam (ZOSYN)  IV  3.375 g Intravenous 3 times per day  . polyethylene glycol  17 g Oral Daily  . pregabalin  75 mg Oral BID  . senna-docusate  2 tablet Oral QHS  . sodium chloride  3 mL Intravenous Q12H   Continuous Infusions: . 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 If 7PM-7AM, please contact night-coverage at [email or web address], password [facility] 01/09/2014, 1:51 PM  LOS: 1 day

## 2014-01-09 NOTE — Progress Notes (Signed)
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  CURRENT MEDS . diltiazem  90 mg Oral Q12H  . diltiazem  10 mg Intravenous Once  . docusate sodium  100 mg Oral BID  . gabapentin  800 mg Oral TID  . gemfibrozil  600 mg Oral BID AC  . insulin aspart  0-9 Units Subcutaneous TID WC  . ipratropium-albuterol  3 mL Nebulization Q4H  . methylPREDNISolone (SOLU-MEDROL) injection  60 mg Intravenous 3 times per day  . morphine  2.5 mg Oral Q4H  . polyethylene glycol  17 g Oral Daily  . pregabalin  75 mg Oral BID  . senna-docusate  2 tablet Oral QHS  . sodium chloride  3 mL Intravenous Q12H  . sodium phosphate  1 enema Rectal Once    OBJECTIVE   Intake/Output Summary (Last 24 hours) at 01/09/14 1052 Last data filed at 01/09/14 0900  Gross per 24 hour  Intake    515 ml  Output    400 ml  Net    115 ml   Filed Weights   01-31-2014 1730  Weight: 86.2 kg (190 lb 0.6 oz)    PHYSICAL EXAM Filed Vitals:   01/09/14 0500 01/09/14 0600 01/09/14 0721 01/09/14 0920  BP: 170/63 137/64 160/66   Pulse: 97 93 94   Temp:   98.7 F (37.1 C)   TempSrc:   Oral   Resp: 13 13 16    Height:      Weight:      SpO2: 95% 97% 96% 97%   General: Alert, oriented x3, no distress Head: no evidence of trauma, PERRL, EOMI, no exophtalmos or lid lag, no myxedema, no xanthelasma; normal ears, nose and oropharynx Neck: normal jugular venous pulsations and no hepatojugular reflux; brisk carotid pulses without delay and no carotid bruits Chest: clear to auscultation, no signs of consolidation by percussion or palpation, normal fremitus, symmetrical and full respiratory excursions Cardiovascular: normal position and quality of the apical impulse, regular rhythm,  normal first and second heart sounds, no rubs or gallops, no murmur Abdomen: no tenderness or distention, no masses by palpation, no abnormal pulsatility or arterial bruits, normal bowel sounds, no hepatosplenomegaly Extremities: no clubbing, cyanosis or edema; 2+ radial, ulnar and brachial pulses bilaterally; 2+ right femoral, posterior tibial and dorsalis pedis pulses; 2+ left femoral, posterior tibial and dorsalis pedis pulses; no subclavian or femoral bruits Neurological: grossly nonfocal  LABS  CBC  Recent Labs  01/31/2014 1150 01/09/14 0247  WBC 13.1* 15.8*  HGB 12.5* 11.7*  HCT 34.5* 32.4*  MCV 86.5 86.2  PLT 119* 643*   Basic Metabolic Panel  Recent Labs  2014-01-31 1150 01/09/14 0247  NA 141 137  K 4.0 4.5  CL 102 96  CO2 19 16*  GLUCOSE 235* 274*  BUN 31* 31*  CREATININE 1.17 1.29  CALCIUM 9.8 9.6  MG 2.1  --    Liver Function Tests No results found for this basename: AST, ALT, ALKPHOS, BILITOT, PROT, ALBUMIN,  in the last 72 hours No results found for this basename: LIPASE, AMYLASE,  in the last 72 hours Cardiac Enzymes  Recent Labs  2014-01-31 1614 01/31/2014 2236 01/09/14 0247  TROPONINI <0.30 <0.30 <0.30  Radiology Studies Imaging results have been reviewed and Ct Abdomen Pelvis Wo Contrast  01/18/2014   CLINICAL DATA:  Constipation. Hepatitis B. 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  EXAM: CT ABDOMEN AND PELVIS WITHOUT CONTRAST  TECHNIQUE: Multidetector CT imaging of the abdomen and pelvis was performed following the standard protocol without intravenous contrast.  COMPARISON:  DG ABD ACUTE W/CHEST dated 01/03/2014  FINDINGS: Unchanged chest findings of mediastinal adenopathy, bilateral pleural effusions, and left lower lobe mass. Hepatic steatosis. Normal spleen, gallbladder, pancreas, and kidneys.  8 x 16 mm left adrenal nodularity, nonspecific, but changed from 2007, not visibly hypervascular, metastasis versus small adenoma (favored) are considerations.  Normal  kidneys ureters, and bladder. No retroperitoneal adenopathy. No bowel obstruction or bowel wall thickening. Moderate gastric distention of a gaseous nature, but no visible outlet obstruction. Moderate stool burden. No osseous findings. Chronic bone island in T12. Superficial soft tissues unremarkable. No pathologically enlarged inguinal adenopathy or hernia. No appendiceal inflammation. Mild prostate enlargement.  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  8 x 16 mm left adrenal nodularity, nonspecific, but interval change since 2007.  No acute intra-abdominal findings.   Electronically Signed   By: Rolla Flatten M.D.   On: 01/12/2014 20:23   Ct Angio Chest Pe W/cm &/or Wo Cm  01/03/2014   CLINICAL DATA:  Left lower lobe mass. Status post bronchoscopy and biopsy 2 days ago. Worsening shortness of breath. Hemoptysis.  EXAM: CT ANGIOGRAPHY CHEST WITH CONTRAST  TECHNIQUE: Multidetector CT imaging of the chest was performed using the standard protocol during bolus administration of intravenous contrast. Multiplanar CT image reconstructions and MIPs were obtained to evaluate the vascular anatomy.  CONTRAST:  70 mL OMNIPAQUE IOHEXOL 350 MG/ML SOLN  COMPARISON:  CT chest 12/31/2013. Chest in two views abdomen earlier this same day.  FINDINGS: There is a moderate right and small left pleural effusion. No pericardial effusion is identified. No pulmonary embolus is identified. The descending left interlobar and ascending right interlobar pulmonary arteries are narrowed by the patient's left lower lobe mass and mediastinal adenopathy.  Bulky mediastinal lymphadenopathy is unchanged. Again seen is a 2.0 cm right paratracheal node on image 28, a 3.1 cm subcarinal node on image 47 and a 2.7 cm left subcarinal lymph node on image 52. 5 cm in diameter left lower lobe mass is again identified. Increased compressive atelectasis is seen in the bases. There is a new nodular  opacity in the right upper lobe measuring 1.1 x 0.8 cm on image 27. Incidentally imaged upper abdomen is unremarkable. No focal bony abnormality is identified.  Review of the MIP images confirms the above findings.  IMPRESSION: Negative for pulmonary embolus. Both the descending left interlobar and ascending right interlobar pulmonary arteries are compressed by lymphadenopathy and the left lower lobe mass.  No change in bulky mediastinal lymphadenopathy and a left lower lobe pulmonary mass.  Some increase in a moderate right and small left pleural effusion.  1.1 cm nodular opacity right upper lobe is new since CT scan 2 weeks ago and indeterminate. Recommend attention on follow-up exams.   Electronically Signed   By: Inge Rise M.D.   On: 01/30/2014 14:33   Dg Abd Acute W/chest  02/02/2014   CLINICAL DATA:  Shortness of breath.  Abdominal pain and distention.  EXAM: ACUTE ABDOMEN SERIES (ABDOMEN 2 VIEW & CHEST 1 VIEW)  COMPARISON:  CT chest 12/31/2013 and PA and lateral chest 01/06/2014.  FINDINGS: Single view of the  chest demonstrates bulky mediastinal lymphadenopathy. Left lower lobe mass is seen as on the prior study. There are small bilateral pleural effusions. No pneumothorax.  Two views of the abdomen show no free intraperitoneal air. The stomach appears distended. There is no evidence of small bowel obstruction. Large volume of stool throughout the colon is noted.  IMPRESSION: Left lower lobe mass and mediastinal lymphadenopathy as seen on prior CT. Small bilateral pleural effusions are identified.  Negative for free intraperitoneal air or bowel obstruction.  Gaseous distention of the stomach.  Large volume of stool throughout the colon.   Electronically Signed   By: Inge Rise M.D.   On: 01/30/2014 13:51    TELE SR  ECG NSSR, NSTT, [id number]  ASSESSMENT AND PLAN Switch to PO diltiazem. No anticoagulation until issues of hemoptysis and possible CNS problems evaluated.   Sanda Klein, MD, [facility] [phone] office [phone] pager 01/09/2014 10:52 AM

## 2014-01-09 NOTE — Progress Notes (Addendum)
Dr. Radford Pax called and updated on patient's unresponsive episode.

## 2014-01-09 NOTE — Consult Note (Signed)
PULMONARY  / CRITICAL CARE MEDICINE  Name: VELMER WOELFEL MRN: [id number] DOB: Jun 21, 1946 PCP Simona Huh, MD    ADMISSION DATE:  01/22/2014  LOS 1 days  CONSULTATION DATE:  01/26/2014  REFERRING MD :  Dr Tyrell Antonio PRIMARY SERVICE: [facility]  CHIEF COMPLAINT:  Dyspnea and hemopptysis post lung bx in setting of stage 4 metastatic melanoma  BRIEF PATIENT DESCRIPTION: see HPI  SIGNIFICANT EVENTS / STUDIES:  01/10/2014 - admit  LINES / TUBES:   CULTURES:   ANTIBIOTICS:   HISTORY OF PRESENT ILLNESS:  68 year old functional male with ECOG 1 at baseline, non smoker, hx of Rt groin melanoma 2004 T2A, No, MO 2.59m depth, Now past several months with progressive dyspnea on exertion to class 3 levels. Evalaution showed bilateral hilar nodes with LLL 4cm mass. S.p EBUS and Tbbx by Dr GServando Snareconfirming metastatic melanoma stage 4 on 01/06/14. Admitted 01/21/2014 with class 4 dyspnea, A Fib RVR and post procedural hemoptysis (small amounts). Dyspnea improved after correction of A Fib RVR. PCCM consulted 01/03/2014 due to dyspnea and pulmonary issues. CT chest shows bilateral effusion along with    SUBJECTIVE: lactic up  VITAL SIGNS: Filed Vitals:   01/09/14 0500 01/09/14 0600 01/09/14 0721 01/09/14 0920  BP: 170/63 137/64 160/66   Pulse: 97 93 94   Temp:   98.7 F (37.1 C)   TempSrc:   Oral   Resp: _0 Height:      Weight:      SpO2: 95% 97% 96% 97%      HEMODYNAMICS:   VENTILATOR SETTINGS: Vent Mode:  [-] PRVC FiO2 (%):  [30 %] 30 % Set Rate:  [28 bmp] 28 bmp Vt Set:  [420 mL] 420 mL PEEP:  [5 cmH20] 5 cmH20 Plateau Pressure:  [4 cEGB15-17cmH20] 20 cmH20 INTAKE / OUTPUT: I/O last 3 completed shifts: In: -  Out: 400 [Urine:400]   PHYSICAL EXAMINATION: General: mild distress Neuro: AxOx3. Speech normal HEENT: jvd wnl Cardiovascular:  s1 s2 rrr no hep Abdomen:  Obese, soft, no r Musculoskeletal:  No cyanosis, no clubbing, no edema Skin:   intact  LABS: PULMONARY  Recent Labs Lab 01/22/2014 1638  PHART 7.432  PCO2ART 27.2*  PO2ART 57.0*  HCO3 18.2*  TCO2 19  O2SAT 91.0    CBC  Recent Labs Lab 01/06/14 0718 02/01/2014 1150 01/09/14 0247  HGB 13.0 12.5* 11.7*  HCT 35.8* 34.5* 32.4*  WBC 11.2* 13.1* 15.8*  PLT 125* 119* 110*    COAGULATION  Recent Labs Lab 01/06/14 0718  INR 1.09    CARDIAC    Recent Labs Lab 01/27/2014 1614 01/18/2014 2236 01/09/14 0247  TROPONINI <0.30 <0.30 <0.30    Recent Labs Lab 01/20/2014 1546 01/09/14 0248  PROBNP 305.4* 1296.0*     CHEMISTRY  Recent Labs Lab 01/06/14 0718 01/09/2014 1150 01/09/14 0247  NA 140 141 137  K 4.9 4.0 4.5  CL 101 102 96  CO2 22 19 16*  GLUCOSE 133* 235* 274*  BUN 39* 31* 31*  CREATININE 1.31 1.17 1.29  CALCIUM 10.4 9.8 9.6  MG  --  2.1  --    Estimated Creatinine Clearance: 57.3 ml/min (by C-G formula based on Cr of 1.29).   LIVER  Recent Labs Lab 01/06/14 0718  AST 50*  ALT 31  ALKPHOS 128*  BILITOT 0.4  PROT 7.2  ALBUMIN 3.7  INR 1.09     INFECTIOUS  Recent Labs Lab 01/09/14 0248  LATICACIDVEN 7.5*  ENDOCRINE CBG (last 3)   Recent Labs  01/06/14 1043 01/03/2014 2201 01/09/14 0720  GLUCAP 125* 201* 243*         IMAGING x48h  Ct Abdomen Pelvis Wo Contrast  01/22/2014   CLINICAL DATA:  Constipation. Hepatitis B. 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  EXAM: CT ABDOMEN AND PELVIS WITHOUT CONTRAST  TECHNIQUE: Multidetector CT imaging of the abdomen and pelvis was performed following the standard protocol without intravenous contrast.  COMPARISON:  DG ABD ACUTE W/CHEST dated 01/30/2014  FINDINGS: Unchanged chest findings of mediastinal adenopathy, bilateral pleural effusions, and left lower lobe mass. Hepatic steatosis. Normal spleen, gallbladder, pancreas, and kidneys.  8 x 16 mm left adrenal nodularity, nonspecific, but changed from 2007, not visibly hypervascular, metastasis versus small adenoma (favored)  are considerations.  Normal kidneys ureters, and bladder. No retroperitoneal adenopathy. No bowel obstruction or bowel wall thickening. Moderate gastric distention of a gaseous nature, but no visible outlet obstruction. Moderate stool burden. No osseous findings. Chronic bone island in T12. Superficial soft tissues unremarkable. No pathologically enlarged inguinal adenopathy or hernia. No appendiceal inflammation. Mild prostate enlargement.  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  8 x 16 mm left adrenal nodularity, nonspecific, but interval change since 2007.  No acute intra-abdominal findings.   Electronically Signed   By: Rolla Flatten M.D.   On: 01/24/2014 20:23   Ct Angio Chest Pe W/cm &/or Wo Cm  01/14/2014   CLINICAL DATA:  Left lower lobe mass. Status post bronchoscopy and biopsy 2 days ago. Worsening shortness of breath. Hemoptysis.  EXAM: CT ANGIOGRAPHY CHEST WITH CONTRAST  TECHNIQUE: Multidetector CT imaging of the chest was performed using the standard protocol during bolus administration of intravenous contrast. Multiplanar CT image reconstructions and MIPs were obtained to evaluate the vascular anatomy.  CONTRAST:  70 mL OMNIPAQUE IOHEXOL 350 MG/ML SOLN  COMPARISON:  CT chest 12/31/2013. Chest in two views abdomen earlier this same day.  FINDINGS: There is a moderate right and small left pleural effusion. No pericardial effusion is identified. No pulmonary embolus is identified. The descending left interlobar and ascending right interlobar pulmonary arteries are narrowed by the patient's left lower lobe mass and mediastinal adenopathy.  Bulky mediastinal lymphadenopathy is unchanged. Again seen is a 2.0 cm right paratracheal node on image 28, a 3.1 cm subcarinal node on image 47 and a 2.7 cm left subcarinal lymph node on image 52. 5 cm in diameter left lower lobe mass is again identified. Increased compressive atelectasis is seen in the  bases. There is a new nodular opacity in the right upper lobe measuring 1.1 x 0.8 cm on image 27. Incidentally imaged upper abdomen is unremarkable. No focal bony abnormality is identified.  Review of the MIP images confirms the above findings.  IMPRESSION: Negative for pulmonary embolus. Both the descending left interlobar and ascending right interlobar pulmonary arteries are compressed by lymphadenopathy and the left lower lobe mass.  No change in bulky mediastinal lymphadenopathy and a left lower lobe pulmonary mass.  Some increase in a moderate right and small left pleural effusion.  1.1 cm nodular opacity right upper lobe is new since CT scan 2 weeks ago and indeterminate. Recommend attention on follow-up exams.   Electronically Signed   By: Inge Rise M.D.   On: 01/03/2014 14:33   Dg Abd Acute W/chest  01/16/2014   CLINICAL DATA:  Shortness of breath.  Abdominal pain and distention.  EXAM: ACUTE ABDOMEN SERIES (ABDOMEN 2  VIEW & CHEST 1 VIEW)  COMPARISON:  CT chest 12/31/2013 and PA and lateral chest 01/06/2014.  FINDINGS: Single view of the chest demonstrates bulky mediastinal lymphadenopathy. Left lower lobe mass is seen as on the prior study. There are small bilateral pleural effusions. No pneumothorax.  Two views of the abdomen show no free intraperitoneal air. The stomach appears distended. There is no evidence of small bowel obstruction. Large volume of stool throughout the colon is noted.  IMPRESSION: Left lower lobe mass and mediastinal lymphadenopathy as seen on prior CT. Small bilateral pleural effusions are identified.  Negative for free intraperitoneal air or bowel obstruction.  Gaseous distention of the stomach.  Large volume of stool throughout the colon.   Electronically Signed   By: Inge Rise M.D.   On: 02/01/2014 13:51     ASSESSMENT / PLAN:  PULMONARY A: #Post procedureal hemoptysis improved R/o post obstructive PNA bibasilr effusions #Acute resp failure - class 4  dsypnea due to melanoma, effusion, A Fib P:   Symptom relief is goal Nebs Steroids empiric Morphine 44m IV and then oral ; titrate depending on response (laxatives with morphine) Zosyn , see infection pcxr today and in am  If distress will tap rt effusion, for now, unimpressed as infection source Holding lasix  CARDIOVASCULAR A: A Fib RVR - converted, sepsis ? P:  Per cards Repeat lactic  ONcology A:  Metastatic melanoma  - new dx P:   Needs oncology consult Needs concurrent palliative care; recommend palliative care consult  INfection: R/o post obstructive process Lactic acidosis noted, liver status? H/o hep B PLAN BC Sputum if able Empiric zosyn Repeat lactic Send hep panel work up for B  LFt in am  UA Pct algo to limit abx  AG acidosis lactic, met alk See above Hold lasix Chem in am    DLavon Paganini FTitus Mould MD, FHuguleyPgr: [id number]

## 2014-01-10 ENCOUNTER — Inpatient Hospital Stay (HOSPITAL_COMMUNITY): Payer: BC Managed Care – PPO

## 2014-01-10 DIAGNOSIS — B1910 Unspecified viral hepatitis B without hepatic coma: Secondary | ICD-10-CM | POA: Diagnosis present

## 2014-01-10 DIAGNOSIS — N179 Acute kidney failure, unspecified: Secondary | ICD-10-CM

## 2014-01-10 DIAGNOSIS — I369 Nonrheumatic tricuspid valve disorder, unspecified: Secondary | ICD-10-CM

## 2014-01-10 DIAGNOSIS — R141 Gas pain: Secondary | ICD-10-CM

## 2014-01-10 DIAGNOSIS — R142 Eructation: Secondary | ICD-10-CM

## 2014-01-10 DIAGNOSIS — R143 Flatulence: Secondary | ICD-10-CM

## 2014-01-10 DIAGNOSIS — R55 Syncope and collapse: Secondary | ICD-10-CM

## 2014-01-10 LAB — COMPREHENSIVE METABOLIC PANEL
ALBUMIN: 3.2 g/dL — AB (ref 3.5–5.2)
ALK PHOS: 98 U/L (ref 39–117)
ALT: 24 U/L (ref 0–53)
AST: 46 U/L — AB (ref 0–37)
BILIRUBIN TOTAL: 0.4 mg/dL (ref 0.3–1.2)
BUN: 39 mg/dL — ABNORMAL HIGH (ref 6–23)
CHLORIDE: 94 meq/L — AB (ref 96–112)
CO2: 17 mEq/L — ABNORMAL LOW (ref 19–32)
Calcium: 10 mg/dL (ref 8.4–10.5)
Creatinine, Ser: 1.43 mg/dL — ABNORMAL HIGH (ref 0.50–1.35)
GFR calc Af Amer: 56 mL/min — ABNORMAL LOW (ref >90)
GFR calc non Af Amer: 48 mL/min — ABNORMAL LOW (ref >90)
Glucose, Bld: 213 mg/dL — ABNORMAL HIGH (ref 70–99)
POTASSIUM: 4.1 meq/L (ref 3.7–5.3)
SODIUM: 136 meq/L — AB (ref 137–147)
TOTAL PROTEIN: 6.3 g/dL (ref 6.0–8.3)

## 2014-01-10 LAB — HEPATITIS PANEL, ACUTE
HCV Ab: NEGATIVE
Hep A IgM: NONREACTIVE
Hep B C IgM: NONREACTIVE

## 2014-01-10 LAB — CBC
HCT: 30.7 % — ABNORMAL LOW (ref 39.0–52.0)
Hemoglobin: 10.9 g/dL — ABNORMAL LOW (ref 13.0–17.0)
MCH: 30.7 pg (ref 26.0–34.0)
MCHC: 35.5 g/dL (ref 30.0–36.0)
MCV: 86.5 fL (ref 78.0–100.0)
PLATELETS: 111 10*3/uL — AB (ref 150–400)
RBC: 3.55 MIL/uL — ABNORMAL LOW (ref 4.22–5.81)
RDW: 12.9 % (ref 11.5–15.5)
WBC: 19.2 10*3/uL — AB (ref 4.0–10.5)

## 2014-01-10 LAB — HEMOGLOBIN A1C
HEMOGLOBIN A1C: 6.2 % — AB (ref <5.7)
Mean Plasma Glucose: 131 mg/dL — ABNORMAL HIGH (ref <117)

## 2014-01-10 LAB — GLUCOSE, CAPILLARY
GLUCOSE-CAPILLARY: 224 mg/dL — AB (ref 70–99)
Glucose-Capillary: 187 mg/dL — ABNORMAL HIGH (ref 70–99)
Glucose-Capillary: 177 mg/dL — ABNORMAL HIGH (ref 70–99)
Glucose-Capillary: 253 mg/dL — ABNORMAL HIGH (ref 70–99)

## 2014-01-10 LAB — PROCALCITONIN: Procalcitonin: <0.1 ng/mL

## 2014-01-10 LAB — HEPATITIS B CORE ANTIBODY, TOTAL: Hep B Core Total Ab: REACTIVE — AB

## 2014-01-10 LAB — HEPATITIS B SURFACE ANTIBODY,QUALITATIVE: HEP B S AB: NEGATIVE

## 2014-01-10 LAB — HEPATITIS B SURF AG CONFIRMATION: HEPATITIS B SURFACE ANTIGEN CONFIRMATION: POSITIVE — AB

## 2014-01-10 MED ORDER — HYDRALAZINE HCL 20 MG/ML IJ SOLN
10.0000 mg | Freq: Four times a day (QID) | INTRAMUSCULAR | Status: DC | PRN
  Administered 2014-01-10 – 2014-01-12 (×3): 10 mg via INTRAVENOUS
  Filled 2014-01-10 (×4): qty 1

## 2014-01-10 MED ORDER — CALCIUM CARBONATE ANTACID 500 MG PO CHEW
1.0000 | CHEWABLE_TABLET | Freq: Three times a day (TID) | ORAL | Status: DC | PRN
  Administered 2014-01-10: 200 mg via ORAL
  Filled 2014-01-10: qty 1

## 2014-01-10 MED ORDER — GADOBENATE DIMEGLUMINE 529 MG/ML IV SOLN
20.0000 mL | Freq: Once | INTRAVENOUS | Status: AC | PRN
  Administered 2014-01-10: 18 mL via INTRAVENOUS

## 2014-01-10 MED ORDER — ALPRAZOLAM 0.25 MG PO TABS
0.2500 mg | ORAL_TABLET | Freq: Two times a day (BID) | ORAL | Status: DC | PRN
  Administered 2014-01-10 – 2014-01-11 (×3): 0.25 mg via ORAL
  Filled 2014-01-10 (×3): qty 1

## 2014-01-10 NOTE — Progress Notes (Addendum)
PULMONARY  / CRITICAL CARE MEDICINE  Name: Andrew Church MRN: [id number] DOB: August 29, 1946  ADMISSION DATE:  01/22/2014  CONSULTATION DATE:  01/25/2014  REFERRING MD :  [facility] PRIMARY SERVICE: [facility]  CHIEF COMPLAINT:  Dyspnea and hemopptysis post lung bx in setting of stage 4 metastatic melanoma  BRIEF PATIENT DESCRIPTION: 68 yo with melanoma metastatic to mediastinal lymph nodes admitted 3/6 with dyspnea, AF/RVR, and bilateral pleural effusions and hemoptysis.   SIGNIFICANT EVENTS / STUDIES:  3/6  Abdomen CT >>> nad, left adrenal nodularity, unchanged since 2007 3/6  Chest CTA >>> LLL mass, lymphadenopathy, bilateral effusions 3/8  TTE >>> Ef 65%, no RWMA 3/8  Brain MRI >>> No overt CNS Mx   LINES / TUBES:  CULTURES: 3/7  Blood >>>  ANTIBIOTICS: Zosyn 3/7>>>  INTERVAL HISTORY:   Anxious.  VITAL SIGNS: Temp:  [97.6 F (36.4 C)-98.6 F (37 C)] 97.6 F (36.4 C) (03/08 1538) Pulse Rate:  [94-119] 109 (03/08 1951) Resp:  [14-23] 23 (03/08 1951) BP: (122-188)/(54-84) 175/80 mmHg (03/08 1951) SpO2:  [91 %-100 %] 98 % (03/08 1951) FiO2 (%):  [32 %] 32 % (03/08 0910)  HEMODYNAMICS:   VENTILATOR: Vent Mode:  [-]  FiO2 (%):  [32 %] 32 %  INPUT / OUTPUT: Intake/Output     03/08 0701 - 03/09 0700   P.O. 1100   I.V. (mL/kg)    IV Piggyback 50   Total Intake(mL/kg) 1150 (13.3)   Urine (mL/kg/hr) 450 (0.4)   Total Output 450   Net +700       Stool Occurrence 1 x    PHYSICAL EXAMINATION: General:  No distress, anxious Neuro:  Awake, alert, cooperative  HEENT:  PERRL Cardiovascular:  RRR, no m/r/g Lungs:  Bilateral diminished air entry, no added sounds Abdomen:  Soft, nontender, bowel sounds diminished Musculoskeletal:  Moves all extremities, no edema Skin:  Intact  LABS: CBC  Recent Labs Lab 01/19/2014 1150 01/09/14 0247 01/10/14 1923  WBC 13.1* 15.8* 19.2*  HGB 12.5* 11.7* 10.9*  HCT 34.5* 32.4* 30.7*  PLT 119* 110* 111*   Coag's  Recent Labs Lab  01/06/14 0718  APTT 34  INR 1.09   BMET  Recent Labs Lab 01/10/2014 1150 01/09/14 0247 01/10/14 0343  NA 141 137 136*  K 4.0 4.5 4.1  CL 102 96 94*  CO2 19 16* 17*  BUN 31* 31* 39*  CREATININE 1.17 1.29 1.43*  GLUCOSE 235* 274* 213*   Electrolytes  Recent Labs Lab 01/24/2014 1150 01/09/14 0247 01/10/14 0343  CALCIUM 9.8 9.6 10.0  MG 2.1  --   --    Sepsis Markers  Recent Labs Lab 01/09/14 0248 01/09/14 1200 01/09/14 2051 01/10/14 0343  LATICACIDVEN 7.5*  --  6.6*  --   PROCALCITON  --  0.13  --  <0.10   ABG  Recent Labs Lab 01/07/2014 1638  PHART 7.432  PCO2ART 27.2*  PO2ART 57.0*   Liver Enzymes  Recent Labs Lab 01/06/14 0718 01/10/14 0343  AST 50* 46*  ALT 31 24  ALKPHOS 128* 98  BILITOT 0.4 0.4  ALBUMIN 3.7 3.2*   Cardiac Enzymes  Recent Labs Lab 01/07/2014 1546 01/05/2014 1614 01/27/2014 2236 01/09/14 0247 01/09/14 0248  TROPONINI  --  <0.30 <0.30 <0.30  --   PROBNP 305.4*  --   --   --  1296.0*   Glucose  Recent Labs Lab 01/09/14 1224 01/09/14 1656 01/09/14 2100 01/10/14 0712 01/10/14 1245 01/10/14 1701  GLUCAP 194* 307* 314* 224*  187* 177*   IMAGING:  Ct Abdomen Pelvis Wo Contrast  01/29/2014   CLINICAL DATA:  Constipation. Hepatitis B. 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  EXAM: CT ABDOMEN AND PELVIS WITHOUT CONTRAST  TECHNIQUE: Multidetector CT imaging of the abdomen and pelvis was performed following the standard protocol without intravenous contrast.  COMPARISON:  DG ABD ACUTE W/CHEST dated 01/18/2014  FINDINGS: Unchanged chest findings of mediastinal adenopathy, bilateral pleural effusions, and left lower lobe mass. Hepatic steatosis. Normal spleen, gallbladder, pancreas, and kidneys.  8 x 16 mm left adrenal nodularity, nonspecific, but changed from 2007, not visibly hypervascular, metastasis versus small adenoma (favored) are considerations.  Normal kidneys ureters, and bladder. No retroperitoneal adenopathy. No bowel obstruction or  bowel wall thickening. Moderate gastric distention of a gaseous nature, but no visible outlet obstruction. Moderate stool burden. No osseous findings. Chronic bone island in T12. Superficial soft tissues unremarkable. No pathologically enlarged inguinal adenopathy or hernia. No appendiceal inflammation. Mild prostate enlargement.  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  8 x 16 mm left adrenal nodularity, nonspecific, but interval change since 2007.  No acute intra-abdominal findings.   Electronically Signed   By: Rolla Flatten M.D.   On: 01/12/2014 20:23   Mr Jeri Cos LK Contrast  01/10/2014   CLINICAL DATA:  Metastatic melanoma.  EXAM: MRI HEAD WITHOUT AND WITH CONTRAST  TECHNIQUE: Multiplanar, multiecho pulse sequences of the brain and surrounding structures were obtained without and with intravenous contrast.  CONTRAST:  5mL MULTIHANCE GADOBENATE DIMEGLUMINE 529 MG/ML IV SOLN  COMPARISON:  Head CT 05/12/2005  FINDINGS: There is no acute infarct. There is no evidence of intracranial hemorrhage, mass, midline shift, or extra-axial fluid collection. There is no abnormal enhancement. There is mild generalized cerebral atrophy. Small, scattered foci of T2 hyperintensity are present in the deep cerebral white matter bilaterally, nonspecific but compatible with mild chronic small vessel ischemic disease.  Orbits are unremarkable. Paranasal sinuses and mastoid air cells are clear. Major intracranial vascular flow voids are preserved. There is heterogeneous marrow signal at C2 involving the dens.  IMPRESSION: 1. No evidence of intracranial metastatic disease or acute intracranial abnormality. 2. Heterogeneous marrow signal in the dens, possibly degenerative although metastatic disease cannot be completely excluded. 3. Mild chronic small vessel ischemic disease.   Electronically Signed   By: Logan Bores   On: 01/10/2014 11:57   Dg Chest Port 1  View  01/10/2014   CLINICAL DATA:  Assess infiltrates.  EXAM: PORTABLE CHEST - 1 VIEW  COMPARISON:  DG CHEST 1V PORT dated 01/09/2014; CT ANGIO CHEST W/CM &/OR WO/CM dated 01/03/2014  FINDINGS: Stable enlargement of the mediastinum is consistent with lymphadenopathy. Increased densities in the left lung could represent edema or layering pleural fluid. Persistent densities in the medial right lung base. Stable consolidation in the retrocardiac space.  IMPRESSION: Increased densities in the left lung could represent edema or layering fluid.  Persistent consolidation in the retrocardiac space.  Mediastinal lymphadenopathy.   Electronically Signed   By: Markus Daft M.D.   On: 01/10/2014 08:27   Dg Chest Port 1 View  01/09/2014   CLINICAL DATA:  Shortness of breath.  EXAM: PORTABLE CHEST - 1 VIEW  COMPARISON:  01/31/2014  FINDINGS: Chest radiograph demonstrates persistent densities in the medial left lower lung. Findings are compatible with volume loss and consolidation. Heart size is grossly stable. The trachea is midline. Again noted is widening of the mediastinum related to the known lymphadenopathy.  IMPRESSION: Persistent consolidation in the medial left lower lung.  Mediastinal lymphadenopathy.   Electronically Signed   By: Markus Daft M.D.   On: 01/09/2014 14:35   ASSESSMENT / PLAN:  PULMONARY A: Acute respiratory failure Post procedureal hemoptysis, resolved Suspected post obstructive pneumonia Bilateral effusions P:   Goal SpO2>92 Trend CXR Will evaluate R chest with [place] and possibly perform therapeutic thoracentesis DuoNeb scheduled and albuterol PRN D/c steroids as no indication  CARDIOVASCULAR A:  AF / RVR, now sinus tachycardia P: Cardiology following Goal MAP>65, HR<110 Lopid, Cardizem, Lisinopril Hydralazine PRN Recheck lactate   RENAL A: AKI Metabolic acidosis, etiology? P: Trend BMP  GI A: Nutrition GI Px is not indicated P: Diet  HEM: A: Metastatic melanoma  - new  dx P:   Awaiting oncology consult Needs concurrent palliative care; recommend palliative care consult  ID A: Suspected postobstructive pneumonia Leukocytosis is likely partly steroid induced P: Abx as above  ENDOCRINE A: DM Hyperglycemia P: SSI Lantus 6  NEURO A: Anxiety Pain P: D/c Xanax Ativan Tramadol Neurontin Lyrica  I have personally obtained history, examined patient, evaluated and interpreted laboratory and imaging results, reviewed medical records, formulated assessment / plan and placed orders.  Doree Fudge, MD Pulmonary and [place] Pager: [phone]  01/10/2014, 8:06 PM

## 2014-01-10 NOTE — Consult Note (Addendum)
PULMONARY  / CRITICAL CARE MEDICINE  Name: Andrew Church MRN: [id number] DOB: 17-Jul-1946 PCP Simona Huh, MD  ADMISSION DATE:  01/11/2014  LOS 2 days  CONSULTATION DATE:  01/21/2014  REFERRING MD :  Dr Tyrell Antonio PRIMARY SERVICE: [facility]  CHIEF COMPLAINT:  Dyspnea and hemopptysis post lung bx in setting of stage 4 metastatic melanoma  BRIEF PATIENT DESCRIPTION: see HPI  SIGNIFICANT EVENTS / STUDIES:  01/14/2014 - admit 3/7 - uncelar lactic acidosis  LINES / TUBES:  CULTURES: BC 3/7>>> Sputum 3/7>>>  ANTIBIOTICS: 3/7 zosyn>>>  HISTORY OF PRESENT ILLNESS:  68 year old functional male with ECOG 1 at baseline, non smoker, hx of Rt groin melanoma 2004 T2A, No, MO 2.82m depth, Now past several months with progressive dyspnea on exertion to class 3 levels. Evalaution showed bilateral hilar nodes with LLL 4cm mass. S.p EBUS and Tbbx by Dr GServando Snareconfirming metastatic melanoma stage 4 on 01/06/14. Admitted 01/11/2014 with class 4 dyspnea, A Fib RVR and post procedural hemoptysis (small amounts). Dyspnea improved after correction of A Fib RVR. PCCM consulted 01/26/2014 due to dyspnea and pulmonary issues. CT chest shows bilateral effusion along with   SUBJECTIVE:  Didn't sleep well Was neg with balance  VITAL SIGNS: Filed Vitals:   01/10/14 0316 01/10/14 0555 01/10/14 0709 01/10/14 0710  BP:  135/65 181/84 181/84  Pulse:  103 106 106  Temp:  98.6 F (37 C) 98.4 F (36.9 C)   TempSrc:  Oral Oral   Resp:      Height:      Weight:      SpO2: 99% 100% 91% 92%    INTAKE / OUTPUT: I/O last 3 completed shifts: In: 1083 [P.O.:900; I.V.:33; IV Piggyback:150] Out: 2900 [Urine:2900]   PHYSICAL EXAMINATION: General: mild distress, about the same Neuro: AxOx3. Speech normal HEENT: jvd wnl Cardiovascular:  s1 s2 rrr no hep Abdomen:  Obese, soft, no r Musculoskeletal:  Limited edema Skin:  intact  LABS: PULMONARY  Recent Labs Lab 01/13/2014 1638  PHART 7.432  PCO2ART 27.2*   PO2ART 57.0*  HCO3 18.2*  TCO2 19  O2SAT 91.0    CBC  Recent Labs Lab 01/06/14 0718 01/04/2014 1150 01/09/14 0247  HGB 13.0 12.5* 11.7*  HCT 35.8* 34.5* 32.4*  WBC 11.2* 13.1* 15.8*  PLT 125* 119* 110*    COAGULATION  Recent Labs Lab 01/06/14 0718  INR 1.09    CARDIAC    Recent Labs Lab 01/21/2014 1614 01/29/2014 2236 01/09/14 0247  TROPONINI <0.30 <0.30 <0.30    Recent Labs Lab 01/27/2014 1546 01/09/14 0248  PROBNP 305.4* 1296.0*     CHEMISTRY  Recent Labs Lab 01/06/14 0718 01/20/2014 1150 01/09/14 0247 01/10/14 0343  NA 140 141 137 136*  K 4.9 4.0 4.5 4.1  CL 101 102 96 94*  CO2 22 19 16* 17*  GLUCOSE 133* 235* 274* 213*  BUN 39* 31* 31* 39*  CREATININE 1.31 1.17 1.29 1.43*  CALCIUM 10.4 9.8 9.6 10.0  MG  --  2.1  --   --    Estimated Creatinine Clearance: 51.7 ml/min (by C-G formula based on Cr of 1.43).   LIVER  Recent Labs Lab 01/06/14 0718 01/10/14 0343  AST 50* 46*  ALT 31 24  ALKPHOS 128* 98  BILITOT 0.4 0.4  PROT 7.2 6.3  ALBUMIN 3.7 3.2*  INR 1.09  --      INFECTIOUS  Recent Labs Lab 01/09/14 0248 01/09/14 1200 01/09/14 2051 01/10/14 0343  LATICACIDVEN 7.5*  --  6.6*  --   PROCALCITON  --  0.13  --  <0.10     ENDOCRINE CBG (last 3)   Recent Labs  01/09/14 1656 01/09/14 2100 01/10/14 0712  GLUCAP 307* 314* 224*         IMAGING x48h  Ct Abdomen Pelvis Wo Contrast  01/26/2014   CLINICAL DATA:  Constipation. Hepatitis B. 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  EXAM: CT ABDOMEN AND PELVIS WITHOUT CONTRAST  TECHNIQUE: Multidetector CT imaging of the abdomen and pelvis was performed following the standard protocol without intravenous contrast.  COMPARISON:  DG ABD ACUTE W/CHEST dated 01/11/2014  FINDINGS: Unchanged chest findings of mediastinal adenopathy, bilateral pleural effusions, and left lower lobe mass. Hepatic steatosis. Normal spleen, gallbladder, pancreas, and kidneys.  8 x 16 mm left adrenal nodularity,  nonspecific, but changed from 2007, not visibly hypervascular, metastasis versus small adenoma (favored) are considerations.  Normal kidneys ureters, and bladder. No retroperitoneal adenopathy. No bowel obstruction or bowel wall thickening. Moderate gastric distention of a gaseous nature, but no visible outlet obstruction. Moderate stool burden. No osseous findings. Chronic bone island in T12. Superficial soft tissues unremarkable. No pathologically enlarged inguinal adenopathy or hernia. No appendiceal inflammation. Mild prostate enlargement.  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  8 x 16 mm left adrenal nodularity, nonspecific, but interval change since 2007.  No acute intra-abdominal findings.   Electronically Signed   By: Rolla Flatten M.D.   On: 01/11/2014 20:23   Ct Angio Chest Pe W/cm &/or Wo Cm  01/06/2014   CLINICAL DATA:  Left lower lobe mass. Status post bronchoscopy and biopsy 2 days ago. Worsening shortness of breath. Hemoptysis.  EXAM: CT ANGIOGRAPHY CHEST WITH CONTRAST  TECHNIQUE: Multidetector CT imaging of the chest was performed using the standard protocol during bolus administration of intravenous contrast. Multiplanar CT image reconstructions and MIPs were obtained to evaluate the vascular anatomy.  CONTRAST:  70 mL OMNIPAQUE IOHEXOL 350 MG/ML SOLN  COMPARISON:  CT chest 12/31/2013. Chest in two views abdomen earlier this same day.  FINDINGS: There is a moderate right and small left pleural effusion. No pericardial effusion is identified. No pulmonary embolus is identified. The descending left interlobar and ascending right interlobar pulmonary arteries are narrowed by the patient's left lower lobe mass and mediastinal adenopathy.  Bulky mediastinal lymphadenopathy is unchanged. Again seen is a 2.0 cm right paratracheal node on image 28, a 3.1 cm subcarinal node on image 47 and a 2.7 cm left subcarinal lymph node on image 52. 5 cm  in diameter left lower lobe mass is again identified. Increased compressive atelectasis is seen in the bases. There is a new nodular opacity in the right upper lobe measuring 1.1 x 0.8 cm on image 27. Incidentally imaged upper abdomen is unremarkable. No focal bony abnormality is identified.  Review of the MIP images confirms the above findings.  IMPRESSION: Negative for pulmonary embolus. Both the descending left interlobar and ascending right interlobar pulmonary arteries are compressed by lymphadenopathy and the left lower lobe mass.  No change in bulky mediastinal lymphadenopathy and a left lower lobe pulmonary mass.  Some increase in a moderate right and small left pleural effusion.  1.1 cm nodular opacity right upper lobe is new since CT scan 2 weeks ago and indeterminate. Recommend attention on follow-up exams.   Electronically Signed   By: Inge Rise M.D.   On: 01/26/2014 14:33   Dg Chest Port 1 View  01/10/2014  CLINICAL DATA:  Assess infiltrates.  EXAM: PORTABLE CHEST - 1 VIEW  COMPARISON:  DG CHEST 1V PORT dated 01/09/2014; CT ANGIO CHEST W/CM &/OR WO/CM dated 01/10/2014  FINDINGS: Stable enlargement of the mediastinum is consistent with lymphadenopathy. Increased densities in the left lung could represent edema or layering pleural fluid. Persistent densities in the medial right lung base. Stable consolidation in the retrocardiac space.  IMPRESSION: Increased densities in the left lung could represent edema or layering fluid.  Persistent consolidation in the retrocardiac space.  Mediastinal lymphadenopathy.   Electronically Signed   By: Markus Daft M.D.   On: 01/10/2014 08:27   Dg Chest Port 1 View  01/09/2014   CLINICAL DATA:  Shortness of breath.  EXAM: PORTABLE CHEST - 1 VIEW  COMPARISON:  02/01/2014  FINDINGS: Chest radiograph demonstrates persistent densities in the medial left lower lung. Findings are compatible with volume loss and consolidation. Heart size is grossly stable. The trachea is  midline. Again noted is widening of the mediastinum related to the known lymphadenopathy.  IMPRESSION: Persistent consolidation in the medial left lower lung.  Mediastinal lymphadenopathy.   Electronically Signed   By: Markus Daft M.D.   On: 01/09/2014 14:35   Dg Abd Acute W/chest  01/16/2014   CLINICAL DATA:  Shortness of breath.  Abdominal pain and distention.  EXAM: ACUTE ABDOMEN SERIES (ABDOMEN 2 VIEW & CHEST 1 VIEW)  COMPARISON:  CT chest 12/31/2013 and PA and lateral chest 01/06/2014.  FINDINGS: Single view of the chest demonstrates bulky mediastinal lymphadenopathy. Left lower lobe mass is seen as on the prior study. There are small bilateral pleural effusions. No pneumothorax.  Two views of the abdomen show no free intraperitoneal air. The stomach appears distended. There is no evidence of small bowel obstruction. Large volume of stool throughout the colon is noted.  IMPRESSION: Left lower lobe mass and mediastinal lymphadenopathy as seen on prior CT. Small bilateral pleural effusions are identified.  Negative for free intraperitoneal air or bowel obstruction.  Gaseous distention of the stomach.  Large volume of stool throughout the colon.   Electronically Signed   By: Inge Rise M.D.   On: 01/11/2014 13:51     ASSESSMENT / PLAN:  PULMONARY A: #Post procedureal hemoptysis improved R/o post obstructive PNA bibasilr effusions #Acute resp failure - class 4 dsypnea due to melanoma, effusion, A Fib P:   Nebs Steroids empiric, maintain at current dosage Zosyn , see infection pcxr repeat in am  If distress will tap rt effusion, continue neg balance remain as goal if able, see renal Repeat pcxr in am  Follow Left haziness, post obstructive?  CARDIOVASCULAR A: A Fib RVR - converted, sepsis ? P:  Per cards Repeat lactic improved, would follow this again in am Lasix off, see renal Assess echo for malignant effusion, not seen on ct  ONcology A:  Metastatic melanoma  - new dx P:    Needs oncology consult Needs concurrent palliative care; recommend palliative care consult Path reviewed  INfection: R/o post obstructive process Lactic acidosis noted, liver status? H/o hep B  PLAN Repeat lactic again in am  Send hep panel work up for B  LFt wnl UA Pct algo to limit abx, unimpressed, may be able to reduce duration Keep zosyn for now, re evaluate 3 PCT levels AG acidosis lactic is unclear etiology, has improved, I favor liver contribution vs/ and Left base process He had a liver bx 1 year ago, need to obtain this result and why  would u do BX if core ab pos only , there must have been a concern for his innate liver fxn then Consider GI consult- per primary team Chem in am   Renal Lactic acidosis, see above Met alk - unclear , likely from volume management prior Worsening renal fxn - continue to hold lasix  Anxiety Prn benzo Need onc in am   Need to address code status  Lavon Paganini. Titus Mould, MD, [place] Pgr: [facility]

## 2014-01-10 NOTE — Progress Notes (Signed)
[facility] PROGRESS NOTE  Andrew Church FBP:[id number] DOB: 1946/06/24 DOA: 01-26-2014 PCP: Simona Huh, MD  Assessment/Plan: Active Problems:    Left Lower Lobe lung Mass: Secondary to metastatic melanoma   Shortness of breath: Secondary bilateral pleural effusions. Patient may need therapeutic thoracentesis as well as diagnostic to rule out infection. Getting echocardiogram today. On IV steroids and nebulizers, for followup chest x-ray in the morning   hepatitis B: Reported previous history. Positive core level. Check quantitative levels.    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 MRI of the brain notes no evidence of brain metastases. Oncology to followup tomorrow    Abdominal distension: Secondary to constipation.   Unspecified constipation: Have started Colace and will give Fleet enema.    Syncope: Initial episode in emergency room and then again last night. No evidence of metastases. May been secondary to hypoxia    Metabolic acidosis: Partially from lactic acidosis.. Unclear why. Critical care following. Lactic acid level better today, but anion gap unchanged   Metabolic alkalosis   OEUMPN(361.44): Unclear. Urinalysis negative. Discussed with critical care. Anion gap today remains unchanged. Could be bacterial translocation of the gut from constipation, although patient clinically looks okay. Blood cultures done-no growth to date and Zosyn started prophylactically. Could possibly be postobstructive pneumonia?    Atrial fibrillation with RVR: Rate controlled on Cardizem. Weaned off of Cardizem drip. Increase by mouth Cardizem.   Code Status: Full code  Family Communication: Wife at the bedside earlier  Disposition Plan: Continue in stepdown until sepsis resolves   Consultants:  Cardiology  Pulmonary/CCM  Procedures:  MRI pending    Antibiotics:  IV Zosyn  3/7-present  HPI/Subjective: Pt feeling quite fatigued, chronic (for the last month) SOB.  Bowels moving.  Objective: Filed Vitals:   01/10/14 1538  BP: 188/78  Pulse: 99  Temp: 97.6 F (36.4 C)  Resp: 16    Intake/Output Summary (Last 24 hours) at 01/10/14 1649 Last data filed at 01/10/14 1019  Gross per 24 hour  Intake    150 ml  Output   2950 ml  Net  -2800 ml   Filed Weights   Jan 26, 2014 1730  Weight: 86.2 kg (190 lb 0.6 oz)    Exam:   General:  A&O x3, minimal distress, secondary to dyspnea  Cardiovascular: RRR S1S2, borderline tachycardia  Respiratory: Decreasedbreath sounds bibasilar  Abdomen: soft, distended, NT, hypoactive bowel sounds  Musculoskeletal: Trace pitting edema bilaterally   Data Reviewed: Basic Metabolic Panel:  Recent Labs Lab 01/06/14 0718 01/26/14 1150 01/09/14 0247 01/10/14 0343  NA 140 141 137 136*  K 4.9 4.0 4.5 4.1  CL 101 102 96 94*  CO2 22 19 16* 17*  GLUCOSE 133* 235* 274* 213*  BUN 39* 31* 31* 39*  CREATININE 1.31 1.17 1.29 1.43*  CALCIUM 10.4 9.8 9.6 10.0  MG  --  2.1  --   --    Liver Function Tests:  Recent Labs Lab 01/06/14 0718 01/10/14 0343  AST 50* 46*  ALT 31 24  ALKPHOS 128* 98  BILITOT 0.4 0.4  PROT 7.2 6.3  ALBUMIN 3.7 3.2*   CBC:  Recent Labs Lab 01/06/14 0718 01/26/14 1150 01/09/14 0247  WBC 11.2* 13.1* 15.8*  HGB 13.0 12.5* 11.7*  HCT 35.8* 34.5* 32.4*  MCV 87.1 86.5 86.2  PLT 125* 119* 110*   Cardiac Enzymes:  Recent Labs Lab 01/26/14 1614 2014/01/26 2236 01/09/14 0247  TROPONINI <0.30 <  0.30 <0.30   BNP (last 3 results)  Recent Labs  01/23/2014 1546 01/09/14 0248  PROBNP 305.4* 1296.0*   CBG:  Recent Labs Lab 01/09/14 1224 01/09/14 1656 01/09/14 2100 01/10/14 0712 01/10/14 1245  GLUCAP 194* 307* 314* 224* 187*    Recent Results (from the past 240 hour(s))  MRSA PCR SCREENING     Status: None   Collection Time    01/25/2014  8:50 PM      Result Value Ref Range  Status   MRSA by PCR NEGATIVE  NEGATIVE Final   Comment:            The GeneXpert MRSA Assay (FDA     approved for NASAL specimens     only), is one component of a     comprehensive MRSA colonization     surveillance program. It is not     intended to diagnose MRSA     infection nor to guide or     monitor treatment for     MRSA infections.  CULTURE, BLOOD (ROUTINE X 2)     Status: None   Collection Time    01/09/14 12:00 PM      Result Value Ref Range Status   Specimen Description BLOOD RIGHT HAND   Final   Special Requests BOTTLES DRAWN AEROBIC AND ANAEROBIC 10CC   Final   Culture  Setup Time     Final   Value: 01/09/2014 19:21     Performed at [facility]   Culture     Final   Value:        BLOOD CULTURE RECEIVED NO GROWTH TO DATE CULTURE WILL BE HELD FOR 5 DAYS BEFORE ISSUING A FINAL NEGATIVE REPORT     Performed at [facility]   Report Status PENDING   Incomplete  CULTURE, BLOOD (ROUTINE X 2)     Status: None   Collection Time    01/09/14 12:10 PM      Result Value Ref Range Status   Specimen Description BLOOD RIGHT ARM   Final   Special Requests BOTTLES DRAWN AEROBIC ONLY 10CC   Final   Culture  Setup Time     Final   Value: 01/09/2014 19:21     Performed at [facility]   Culture     Final   Value:        BLOOD CULTURE RECEIVED NO GROWTH TO DATE CULTURE WILL BE HELD FOR 5 DAYS BEFORE ISSUING A FINAL NEGATIVE REPORT     Performed at [facility]   Report Status PENDING   Incomplete     Studies: Ct Abdomen Pelvis Wo Contrast  01/16/2014   CLINICAL DATA:  Constipation. Hepatitis B. 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  EXAM: CT ABDOMEN AND PELVIS WITHOUT CONTRAST  TECHNIQUE: Multidetector CT imaging of the abdomen and pelvis was performed following the standard protocol without intravenous contrast.  COMPARISON:  DG ABD ACUTE W/CHEST dated 01/07/2014  FINDINGS: Unchanged chest findings of mediastinal adenopathy, bilateral pleural effusions,  and left lower lobe mass. Hepatic steatosis. Normal spleen, gallbladder, pancreas, and kidneys.  8 x 16 mm left adrenal nodularity, nonspecific, but changed from 2007, not visibly hypervascular, metastasis versus small adenoma (favored) are considerations.  Normal kidneys ureters, and bladder. No retroperitoneal adenopathy. No bowel obstruction or bowel wall thickening. Moderate gastric distention of a gaseous nature, but no visible outlet obstruction. Moderate stool burden. No osseous findings. Chronic bone island in T12. Superficial soft tissues unremarkable. No  pathologically enlarged inguinal adenopathy or hernia. No appendiceal inflammation. Mild prostate enlargement.  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  8 x 16 mm left adrenal nodularity, nonspecific, but interval change since 2007.  No acute intra-abdominal findings.   Electronically Signed   By: Rolla Flatten M.D.   On: 01/14/2014 20:23   Mr Jeri Cos KZ Contrast  01/10/2014   CLINICAL DATA:  Metastatic melanoma.  EXAM: MRI HEAD WITHOUT AND WITH CONTRAST  TECHNIQUE: Multiplanar, multiecho pulse sequences of the brain and surrounding structures were obtained without and with intravenous contrast.  CONTRAST:  9mL MULTIHANCE GADOBENATE DIMEGLUMINE 529 MG/ML IV SOLN  COMPARISON:  Head CT 05/12/2005  FINDINGS: There is no acute infarct. There is no evidence of intracranial hemorrhage, mass, midline shift, or extra-axial fluid collection. There is no abnormal enhancement. There is mild generalized cerebral atrophy. Small, scattered foci of T2 hyperintensity are present in the deep cerebral white matter bilaterally, nonspecific but compatible with mild chronic small vessel ischemic disease.  Orbits are unremarkable. Paranasal sinuses and mastoid air cells are clear. Major intracranial vascular flow voids are preserved. There is heterogeneous marrow signal at C2 involving the dens.  IMPRESSION: 1. No  evidence of intracranial metastatic disease or acute intracranial abnormality. 2. Heterogeneous marrow signal in the dens, possibly degenerative although metastatic disease cannot be completely excluded. 3. Mild chronic small vessel ischemic disease.   Electronically Signed   By: Logan Bores   On: 01/10/2014 11:57   Dg Chest Port 1 View  01/10/2014   CLINICAL DATA:  Assess infiltrates.  EXAM: PORTABLE CHEST - 1 VIEW  COMPARISON:  DG CHEST 1V PORT dated 01/09/2014; CT ANGIO CHEST W/CM &/OR WO/CM dated 01/21/2014  FINDINGS: Stable enlargement of the mediastinum is consistent with lymphadenopathy. Increased densities in the left lung could represent edema or layering pleural fluid. Persistent densities in the medial right lung base. Stable consolidation in the retrocardiac space.  IMPRESSION: Increased densities in the left lung could represent edema or layering fluid.  Persistent consolidation in the retrocardiac space.  Mediastinal lymphadenopathy.   Electronically Signed   By: Markus Daft M.D.   On: 01/10/2014 08:27   Dg Chest Port 1 View  01/09/2014   CLINICAL DATA:  Shortness of breath.  EXAM: PORTABLE CHEST - 1 VIEW  COMPARISON:  01/31/2014  FINDINGS: Chest radiograph demonstrates persistent densities in the medial left lower lung. Findings are compatible with volume loss and consolidation. Heart size is grossly stable. The trachea is midline. Again noted is widening of the mediastinum related to the known lymphadenopathy.  IMPRESSION: Persistent consolidation in the medial left lower lung.  Mediastinal lymphadenopathy.   Electronically Signed   By: Markus Daft M.D.   On: 01/09/2014 14:35    Scheduled Meds: . diltiazem  90 mg Oral Q12H  . diltiazem  10 mg Intravenous Once  . docusate sodium  100 mg Oral BID  . gabapentin  800 mg Oral TID  . gemfibrozil  600 mg Oral BID AC  . insulin aspart  0-20 Units Subcutaneous TID WC  . insulin aspart  0-5 Units Subcutaneous QHS  . insulin glargine  6 Units  Subcutaneous QHS  . ipratropium-albuterol  3 mL Nebulization Q4H  . methylPREDNISolone (SOLU-MEDROL) injection  60 mg Intravenous 3 times per day  . piperacillin-tazobactam (ZOSYN)  IV  3.375 g Intravenous 3 times per day  . polyethylene glycol  17 g Oral Daily  . pregabalin  75 mg  Oral BID  . senna-docusate  2 tablet Oral QHS  . sodium chloride  3 mL Intravenous Q12H   Continuous Infusions: . diltiazem (CARDIZEM) infusion Stopped (01/09/14 1247)    Active Problems:   Melanoma   Left Lower Lobe lung Mass   Shortness of breath   Acute respiratory failure   Metastatic melanoma to lung   Abdominal distension   Unspecified constipation   Syncope   Metabolic acidosis   Metabolic alkalosis   [id number])   Atrial fibrillation with RVR    Time spent: 40 minutes    [facility] Pager [phone]. If 7PM-7AM, please contact night-coverage at [email or web address], password [facility] 01/10/2014, 4:49 PM  LOS: 2 days

## 2014-01-10 NOTE — Progress Notes (Signed)
Echo Lab  2D Echocardiogram completed.  [place], RDCS 01/10/2014 3:40 PM

## 2014-01-10 NOTE — Progress Notes (Signed)
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 diltiazem  90 mg Oral Q12H  . diltiazem  10 mg Intravenous Once  . docusate sodium  100 mg Oral BID  . gabapentin  800 mg Oral TID  . gemfibrozil  600 mg Oral BID AC  . insulin aspart  0-20 Units Subcutaneous TID WC  . insulin aspart  0-5 Units Subcutaneous QHS  . insulin glargine  6 Units Subcutaneous QHS  . ipratropium-albuterol  3 mL Nebulization Q4H  . methylPREDNISolone (SOLU-MEDROL) injection  60 mg Intravenous 3 times per day  . morphine  2.5 mg Oral Q4H  . piperacillin-tazobactam (ZOSYN)  IV  3.375 g Intravenous 3 times per day  . polyethylene glycol  17 g Oral Daily  . pregabalin  75 mg Oral BID  . senna-docusate  2 tablet Oral QHS  . sodium chloride  3 mL Intravenous Q12H    OBJECTIVE   Intake/Output Summary (Last 24 hours) at 01/10/14 0905 Last data filed at 01/10/14 0555  Gross per 24 hour  Intake    568 ml  Output   2500 ml  Net  -1932 ml   Filed Weights   01/28/2014 1730  Weight: 86.2 kg (190 lb 0.6 oz)    PHYSICAL EXAM Filed Vitals:   01/10/14 0316 01/10/14 0555 01/10/14 0709 01/10/14 0710  BP:  135/65 181/84 181/84  Pulse:  103 106 106  Temp:  98.6 F (37 C) 98.4 F (36.9 C)   TempSrc:  Oral Oral   Resp:      Height:      Weight:      SpO2: 99% 100% 91% 92%   General: Alert, oriented x3, no distress Head: no evidence of trauma, PERRL, EOMI, no exophtalmos or lid lag, no myxedema, no xanthelasma; normal ears, nose and oropharynx Neck:  normal jugular venous pulsations and no hepatojugular reflux; brisk carotid pulses without delay and no carotid bruits Chest: bilateral wheezing, most obvious left chest, no signs of consolidation by percussion or palpation, normal fremitus, symmetrical and full respiratory excursions Cardiovascular: normal position and quality of the apical impulse, regular rhythm, normal first and second heart sounds, no rubs or gallops, no murmur Abdomen: no tenderness or distention, no masses by palpation, no abnormal pulsatility or arterial bruits, normal bowel sounds, no hepatosplenomegaly Extremities: no clubbing, cyanosis or edema; 2+ radial, ulnar and brachial pulses bilaterally; 2+ right femoral, posterior tibial and dorsalis pedis pulses; 2+ left femoral, posterior tibial and dorsalis pedis pulses; no subclavian or femoral bruits Neurological: grossly nonfocal  LABS  CBC  Recent Labs  01/24/2014 1150 01/09/14 0247  WBC 13.1* 15.8*  HGB 12.5* 11.7*  HCT 34.5* 32.4*  MCV 86.5 86.2  PLT 119* [id number]*   Basic Metabolic Panel  Recent Labs  01/26/2014 1150 01/09/14 0247 01/10/14 0343  NA 141 137 136*  K 4.0 4.5 4.1  CL 102 96 94*  CO2 19 16* 17*  GLUCOSE 235* 274* 213*  BUN 31* 31* 39*  CREATININE 1.17 1.29 1.43*  CALCIUM 9.8 9.6 10.0  MG 2.1  --   --    Liver Function Tests  Recent Labs  01/10/14 0343  AST 46*  ALT 24  ALKPHOS 98  BILITOT 0.4  PROT 6.3  ALBUMIN 3.2*   No results found for this basename: LIPASE, AMYLASE,  in the last 72 hours Cardiac Enzymes  Recent Labs  01/07/2014 1614 01/18/2014 2236 01/09/14 0247  TROPONINI <0.30 <0.30 <0.30    Radiology Studies Imaging results have been reviewed and Ct Abdomen Pelvis Wo Contrast  01/09/2014   CLINICAL DATA:  Constipation. Hepatitis B. 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  EXAM: CT ABDOMEN AND PELVIS WITHOUT CONTRAST  TECHNIQUE: Multidetector CT imaging of the abdomen and pelvis was performed following the standard protocol  without intravenous contrast.  COMPARISON:  DG ABD ACUTE W/CHEST dated 01/16/2014  FINDINGS: Unchanged chest findings of mediastinal adenopathy, bilateral pleural effusions, and left lower lobe mass. Hepatic steatosis. Normal spleen, gallbladder, pancreas, and kidneys.  8 x 16 mm left adrenal nodularity, nonspecific, but changed from 2007, not visibly hypervascular, metastasis versus small adenoma (favored) are considerations.  Normal kidneys ureters, and bladder. No retroperitoneal adenopathy. No bowel obstruction or bowel wall thickening. Moderate gastric distention of a gaseous nature, but no visible outlet obstruction. Moderate stool burden. No osseous findings. Chronic bone island in T12. Superficial soft tissues unremarkable. No pathologically enlarged inguinal adenopathy or hernia. No appendiceal inflammation. Mild prostate enlargement.  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  8 x 16 mm left adrenal nodularity, nonspecific, but interval change since 2007.  No acute intra-abdominal findings.   Electronically Signed   By: Rolla Flatten M.D.   On: 01/26/2014 20:23   Ct Angio Chest Pe W/cm &/or Wo Cm  01/20/2014   CLINICAL DATA:  Left lower lobe mass. Status post bronchoscopy and biopsy 2 days ago. Worsening shortness of breath. Hemoptysis.  EXAM: CT ANGIOGRAPHY CHEST WITH CONTRAST  TECHNIQUE: Multidetector CT imaging of the chest was performed using the standard protocol during bolus administration of intravenous contrast. Multiplanar CT image reconstructions and MIPs were obtained to evaluate the vascular anatomy.  CONTRAST:  70 mL OMNIPAQUE IOHEXOL 350 MG/ML SOLN  COMPARISON:  CT chest 12/31/2013. Chest in two views abdomen earlier this same day.  FINDINGS: There is a moderate right and small left pleural effusion. No pericardial effusion is identified. No pulmonary embolus is identified. The descending left interlobar and ascending right interlobar  pulmonary arteries are narrowed by the patient's left lower lobe mass and mediastinal adenopathy.  Bulky mediastinal lymphadenopathy is unchanged. Again seen is a 2.0 cm right paratracheal node on image 28, a 3.1 cm subcarinal node on image 47 and a 2.7 cm left subcarinal lymph node on image 52. 5 cm in diameter left lower lobe mass is again identified. Increased compressive atelectasis is seen in the bases. There is a new nodular opacity in the right upper lobe measuring 1.1 x 0.8 cm on image 27. Incidentally imaged upper abdomen is unremarkable. No focal bony abnormality is identified.  Review of the MIP images confirms the above findings.  IMPRESSION: Negative for pulmonary embolus. Both the descending left interlobar and ascending right interlobar pulmonary arteries are compressed by lymphadenopathy and the left lower lobe mass.  No change in bulky mediastinal lymphadenopathy and a left lower lobe pulmonary mass.  Some increase in a moderate right and small left pleural effusion.  1.1 cm nodular opacity right upper  lobe is new since CT scan 2 weeks ago and indeterminate. Recommend attention on follow-up exams.   Electronically Signed   By: Inge Rise M.D.   On: 01/10/14 14:33   Dg Chest Port 1 View  01/10/2014   CLINICAL DATA:  Assess infiltrates.  EXAM: PORTABLE CHEST - 1 VIEW  COMPARISON:  DG CHEST 1V PORT dated 01/09/2014; CT ANGIO CHEST W/CM &/OR WO/CM dated 01-10-2014  FINDINGS: Stable enlargement of the mediastinum is consistent with lymphadenopathy. Increased densities in the left lung could represent edema or layering pleural fluid. Persistent densities in the medial right lung base. Stable consolidation in the retrocardiac space.  IMPRESSION: Increased densities in the left lung could represent edema or layering fluid.  Persistent consolidation in the retrocardiac space.  Mediastinal lymphadenopathy.   Electronically Signed   By: Markus Daft M.D.   On: 01/10/2014 08:27   Dg Chest Port 1  View  01/09/2014   CLINICAL DATA:  Shortness of breath.  EXAM: PORTABLE CHEST - 1 VIEW  COMPARISON:  01-10-14  FINDINGS: Chest radiograph demonstrates persistent densities in the medial left lower lung. Findings are compatible with volume loss and consolidation. Heart size is grossly stable. The trachea is midline. Again noted is widening of the mediastinum related to the known lymphadenopathy.  IMPRESSION: Persistent consolidation in the medial left lower lung.  Mediastinal lymphadenopathy.   Electronically Signed   By: Markus Daft M.D.   On: 01/09/2014 14:35   Dg Abd Acute W/chest  2014/01/10   CLINICAL DATA:  Shortness of breath.  Abdominal pain and distention.  EXAM: ACUTE ABDOMEN SERIES (ABDOMEN 2 VIEW & CHEST 1 VIEW)  COMPARISON:  CT chest 12/31/2013 and PA and lateral chest 01/06/2014.  FINDINGS: Single view of the chest demonstrates bulky mediastinal lymphadenopathy. Left lower lobe mass is seen as on the prior study. There are small bilateral pleural effusions. No pneumothorax.  Two views of the abdomen show no free intraperitoneal air. The stomach appears distended. There is no evidence of small bowel obstruction. Large volume of stool throughout the colon is noted.  IMPRESSION: Left lower lobe mass and mediastinal lymphadenopathy as seen on prior CT. Small bilateral pleural effusions are identified.  Negative for free intraperitoneal air or bowel obstruction.  Gaseous distention of the stomach.  Large volume of stool throughout the colon.   Electronically Signed   By: Inge Rise M.D.   On: 01/10/2014 13:51   TELE STach  ECG NSR, NSST  ASSESSMENT AND PLAN  Tachycardia may be related to bronchodilators and pulmonary process. Watch renal function closely since he has developed mild acute renal failure. He is no longer receiving diuretics. Normal systolic function. Maintain diltiazem for rate control in case AF recurs, also for HTN. (was on lisinopril for HBP as an outpatient). No  anticoagulation due to hemoptysis and uncertain CNS status.  Sanda Klein, MD, [facility] [phone] office [phone] pager 01/10/2014 9:05 AM

## 2014-01-10 NOTE — Progress Notes (Signed)
Utilization Review Completed.Donne Anon T3/06/2014

## 2014-01-11 ENCOUNTER — Ambulatory Visit: Payer: BC Managed Care – PPO | Admitting: Internal Medicine

## 2014-01-11 ENCOUNTER — Other Ambulatory Visit: Payer: Self-pay | Admitting: Internal Medicine

## 2014-01-11 ENCOUNTER — Inpatient Hospital Stay (HOSPITAL_COMMUNITY): Payer: BC Managed Care – PPO

## 2014-01-11 ENCOUNTER — Other Ambulatory Visit: Payer: BC Managed Care – PPO

## 2014-01-11 ENCOUNTER — Ambulatory Visit: Payer: BC Managed Care – PPO

## 2014-01-11 DIAGNOSIS — A419 Sepsis, unspecified organism: Secondary | ICD-10-CM | POA: Diagnosis not present

## 2014-01-11 DIAGNOSIS — C7800 Secondary malignant neoplasm of unspecified lung: Secondary | ICD-10-CM

## 2014-01-11 DIAGNOSIS — J9600 Acute respiratory failure, unspecified whether with hypoxia or hypercapnia: Secondary | ICD-10-CM | POA: Diagnosis not present

## 2014-01-11 DIAGNOSIS — B1910 Unspecified viral hepatitis B without hepatic coma: Secondary | ICD-10-CM

## 2014-01-11 LAB — COMPREHENSIVE METABOLIC PANEL
ALBUMIN: 3.1 g/dL — AB (ref 3.5–5.2)
ALT: 23 U/L (ref 0–53)
AST: 39 U/L — AB (ref 0–37)
Alkaline Phosphatase: 90 U/L (ref 39–117)
BILIRUBIN TOTAL: 0.3 mg/dL (ref 0.3–1.2)
BUN: 48 mg/dL — ABNORMAL HIGH (ref 6–23)
CHLORIDE: 98 meq/L (ref 96–112)
CO2: 20 mEq/L (ref 19–32)
Calcium: 9.9 mg/dL (ref 8.4–10.5)
Creatinine, Ser: 1.29 mg/dL (ref 0.50–1.35)
GFR calc Af Amer: 64 mL/min — ABNORMAL LOW (ref >90)
GFR, EST NON AFRICAN AMERICAN: 55 mL/min — AB (ref >90)
Glucose, Bld: 178 mg/dL — ABNORMAL HIGH (ref 70–99)
Potassium: 4.3 mEq/L (ref 3.7–5.3)
SODIUM: 139 meq/L (ref 137–147)
Total Protein: 6 g/dL (ref 6.0–8.3)

## 2014-01-11 LAB — LACTATE DEHYDROGENASE, PLEURAL OR PERITONEAL FLUID: LD, Fluid: 976 U/L — ABNORMAL HIGH (ref 3–23)

## 2014-01-11 LAB — BODY FLUID CELL COUNT WITH DIFFERENTIAL
Eos, Fluid: 0 %
LYMPHS FL: 45 %
Monocyte-Macrophage-Serous Fluid: 49 % — ABNORMAL LOW (ref 50–90)
NEUTROPHIL FLUID: 6 % (ref 0–25)
Total Nucleated Cell Count, Fluid: 1034 cu mm — ABNORMAL HIGH (ref 0–1000)

## 2014-01-11 LAB — CBC
HCT: 31.1 % — ABNORMAL LOW (ref 39.0–52.0)
Hemoglobin: 10.9 g/dL — ABNORMAL LOW (ref 13.0–17.0)
MCH: 30.4 pg (ref 26.0–34.0)
MCHC: 35 g/dL (ref 30.0–36.0)
MCV: 86.6 fL (ref 78.0–100.0)
PLATELETS: 104 10*3/uL — AB (ref 150–400)
RBC: 3.59 MIL/uL — AB (ref 4.22–5.81)
RDW: 12.8 % (ref 11.5–15.5)
WBC: 19.9 10*3/uL — AB (ref 4.0–10.5)

## 2014-01-11 LAB — HEPATITIS B DNA, ULTRAQUANTITATIVE, PCR
Hepatitis B DNA (Calc): 12449 copies/mL — ABNORMAL HIGH (ref <116)
Hepatitis B DNA: 2139 IU/mL — ABNORMAL HIGH (ref <20)

## 2014-01-11 LAB — LACTATE DEHYDROGENASE: LDH: 2366 U/L — ABNORMAL HIGH (ref 94–250)

## 2014-01-11 LAB — GLUCOSE, CAPILLARY
GLUCOSE-CAPILLARY: 166 mg/dL — AB (ref 70–99)
Glucose-Capillary: 176 mg/dL — ABNORMAL HIGH (ref 70–99)
Glucose-Capillary: 239 mg/dL — ABNORMAL HIGH (ref 70–99)
Glucose-Capillary: 169 mg/dL — ABNORMAL HIGH (ref 70–99)

## 2014-01-11 LAB — PROCALCITONIN: Procalcitonin: 0.15 ng/mL

## 2014-01-11 LAB — PROTEIN, BODY FLUID: TOTAL PROTEIN, FLUID: 2.9 g/dL

## 2014-01-11 LAB — CHOLESTEROL, TOTAL: Cholesterol: 172 mg/dL (ref 0–200)

## 2014-01-11 LAB — LACTIC ACID, PLASMA
LACTIC ACID, VENOUS: 0.2 mmol/L — AB (ref 0.5–2.2)
Lactic Acid, Venous: 6.2 mmol/L — ABNORMAL HIGH (ref 0.5–2.2)

## 2014-01-11 LAB — PROTEIN, TOTAL: Total Protein: 6.4 g/dL (ref 6.0–8.3)

## 2014-01-11 MED ORDER — DILTIAZEM HCL ER COATED BEADS 240 MG PO CP24
240.0000 mg | ORAL_CAPSULE | Freq: Every day | ORAL | Status: DC
  Administered 2014-01-12: 240 mg via ORAL
  Filled 2014-01-11: qty 1

## 2014-01-11 MED ORDER — IPRATROPIUM-ALBUTEROL 0.5-2.5 (3) MG/3ML IN SOLN
3.0000 mL | Freq: Four times a day (QID) | RESPIRATORY_TRACT | Status: DC
  Administered 2014-01-11 – 2014-01-12 (×3): 3 mL via RESPIRATORY_TRACT
  Filled 2014-01-11 (×3): qty 3

## 2014-01-11 MED ORDER — LISINOPRIL 20 MG PO TABS
20.0000 mg | ORAL_TABLET | Freq: Every day | ORAL | Status: DC
  Administered 2014-01-11 – 2014-01-12 (×2): 20 mg via ORAL
  Filled 2014-01-11 (×2): qty 1

## 2014-01-11 MED ORDER — LORAZEPAM 1 MG PO TABS
2.0000 mg | ORAL_TABLET | ORAL | Status: DC | PRN
  Administered 2014-01-11 – 2014-01-12 (×5): 2 mg via ORAL
  Filled 2014-01-11 (×5): qty 2

## 2014-01-11 MED ORDER — DILTIAZEM HCL ER COATED BEADS 180 MG PO CP24
180.0000 mg | ORAL_CAPSULE | Freq: Every day | ORAL | Status: DC
  Administered 2014-01-11: 180 mg via ORAL
  Filled 2014-01-11 (×2): qty 1

## 2014-01-11 MED ORDER — DILTIAZEM HCL ER 60 MG PO CP12
60.0000 mg | ORAL_CAPSULE | ORAL | Status: AC
  Administered 2014-01-11: 60 mg via ORAL
  Filled 2014-01-11: qty 1

## 2014-01-11 NOTE — Progress Notes (Signed)
Inpatient Diabetes Program Recommendations  AACE/ADA: New Consensus Statement on Inpatient Glycemic Control (2013)  Target Ranges:  Prepandial:   less than 140 mg/dL      Peak postprandial:   less than 180 mg/dL (1-2 hours)      Critically ill patients:  140 - 180 mg/dL  Results for CORNELIOUS, BARTOLUCCI (MRN [id number]) as of 01/11/2014 13:28  Ref. Range 01/10/2014 12:45 01/10/2014 17:01 01/10/2014 21:21 01/11/2014 07:41 01/11/2014 11:44  Glucose-Capillary Latest Range: 70-99 mg/dL 187 (H) 177 (H) 253 (H) 176 (H) 239 (H)   Inpatient Diabetes Program Recommendations Insulin - Meal Coverage: consider adding Novolog 3 units TID with meals for elevated postprandials related to steroid therapy  Thank you  Raoul Pitch BSN, RN,CDE Inpatient Diabetes Coordinator [phone] (team pager)

## 2014-01-11 NOTE — Procedures (Signed)
Name:  JOVONNI BORQUEZ MRN:  [id number] DOB:  [birth year]  PROCEDURE NOTE  Procedure:  Ultrasound-guided thoracentesis.  Indications:  Pleural effusion.  Consent:  Procedure, benefits, risks and alternatives discussed.  Questions answered.  Consent obtained.  Anesthesia:  A total of 10 mL of 1% Lidocaine was used for local infiltration anesthesia.  Procedure summary:  Appropriate equipment was assembled.  The patient was identified as Lanier Prude and safety timeout was performed. The patient was placed sitting on the edge of the bed.  Sterile technique was used. The patient's right posterior chest was prepped using chlorhexidine / alcohol scrub and the field was draped in usual sterile fashion. The diaphragm, free-floating pleural fluid and the lung were identified by ultrasound. Images were documented and puncture site was marked. After the adequate anesthesia was achieved, the pleural space was cannulated with the catheter-over-needle assembly without difficulty. The catheter was advanced into the pleural space and the needle was withdrawn. A total of 1000 mL of sanguinous fluid was aspirated. Catheter was withdrawn. Sterile dressing was applied. Post-procedure chest x-ray was ordered.  Specimens sent:  Pleural fluid for pH, cell count, chemistry and cytology.  Complications:  No immediate complications were noted.  Hemodynamic parameters and oxygenation remained stable throughout the procedure.  Estimated blood loss:  Less then 5 mL.  Penne Lash, M.D. [facility] Pager: [phone]  01/11/2014, 2:44 PM

## 2014-01-11 NOTE — Progress Notes (Signed)
SUBJECTIVE:  No complaints  OBJECTIVE:   Vitals:   Filed Vitals:   01/11/14 0441 01/11/14 0514 01/11/14 0744 01/11/14 0829  BP: 149/72  158/70   Pulse: 106 101    Temp:   98.5 F (36.9 C)   TempSrc:   Oral   Resp: 17 15 13    Height:      Weight:      SpO2: 97% 91% 90% 96%   I&O's:   Intake/Output Summary (Last 24 hours) at 01/11/14 9604 Last data filed at 01/11/14 0800  Gross per 24 hour  Intake   1730 ml  Output   1600 ml  Net    130 ml   TELEMETRY: Reviewed telemetry pt in sinus tachycardia     PHYSICAL EXAM General: Well developed, well nourished, in no acute distress Head: Eyes PERRLA, No xanthomas.   Normal cephalic and atramatic  Lungs:   Clear bilaterally to auscultation anteriorly. Heart:   HRRR S1 S2 Pulses are 2+ & equal. Abdomen: Bowel sounds are positive, abdomen soft and non-tender without masses  Extremities:   No clubbing, cyanosis or edema.  DP +1 Neuro: Alert and oriented X 3. Psych:  Good affect, responds appropriately   LABS: Basic Metabolic Panel:  Recent Labs  Jan 19, 2014 1150  01/10/14 0343 01/11/14 0350  NA 141  < > 136* 139  K 4.0  < > 4.1 4.3  CL 102  < > 94* 98  CO2 19  < > 17* 20  GLUCOSE 235*  < > 213* 178*  BUN 31*  < > 39* 48*  CREATININE 1.17  < > 1.43* 1.29  CALCIUM 9.8  < > 10.0 9.9  MG 2.1  --   --   --   < > = values in this interval not displayed. Liver Function Tests:  Recent Labs  01/10/14 0343 01/11/14 0350  AST 46* 39*  ALT 24 23  ALKPHOS 98 90  BILITOT 0.4 0.3  PROT 6.3 6.0  ALBUMIN 3.2* 3.1*   No results found for this basename: LIPASE, AMYLASE,  in the last 72 hours CBC:  Recent Labs  01/10/14 1923 01/11/14 0350  WBC 19.2* 19.9*  HGB 10.9* 10.9*  HCT 30.7* 31.1*  MCV 86.5 86.6  PLT 111* 104*   Cardiac Enzymes:  Recent Labs  January 19, 2014 1614 01/19/2014 2236 01/09/14 0247  TROPONINI <0.30 <0.30 <0.30   BNP: No components found with this basename: POCBNP,  D-Dimer: No results found for  this basename: DDIMER,  in the last 72 hours Hemoglobin A1C:  Recent Labs  01/09/14 2052  HGBA1C 6.2*   Fasting Lipid Panel: No results found for this basename: CHOL, HDL, LDLCALC, TRIG, CHOLHDL, LDLDIRECT,  in the last 72 hours Thyroid Function Tests: No results found for this basename: TSH, T4TOTAL, FREET3, T3FREE, THYROIDAB,  in the last 72 hours Anemia Panel: No results found for this basename: VITAMINB12, FOLATE, FERRITIN, TIBC, IRON, RETICCTPCT,  in the last 72 hours Coag Panel:   Lab Results  Component Value Date   INR 1.09 01/06/2014   INR 1.04 09/03/2013    RADIOLOGY: Ct Abdomen Pelvis Wo Contrast  01-19-14   CLINICAL DATA:  Constipation. Hepatitis B. 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  EXAM: CT ABDOMEN AND PELVIS WITHOUT CONTRAST  TECHNIQUE: Multidetector CT imaging of the abdomen and pelvis was performed following the standard protocol without intravenous contrast.  COMPARISON:  DG ABD ACUTE W/CHEST dated 01-19-14  FINDINGS: Unchanged chest findings of mediastinal adenopathy, bilateral pleural effusions,  and left lower lobe mass. Hepatic steatosis. Normal spleen, gallbladder, pancreas, and kidneys.  8 x 16 mm left adrenal nodularity, nonspecific, but changed from 2007, not visibly hypervascular, metastasis versus small adenoma (favored) are considerations.  Normal kidneys ureters, and bladder. No retroperitoneal adenopathy. No bowel obstruction or bowel wall thickening. Moderate gastric distention of a gaseous nature, but no visible outlet obstruction. Moderate stool burden. No osseous findings. Chronic bone island in T12. Superficial soft tissues unremarkable. No pathologically enlarged inguinal adenopathy or hernia. No appendiceal inflammation. Mild prostate enlargement.  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  8 x 16 mm left adrenal nodularity, nonspecific, but interval change since 2007.  No acute intra-abdominal  findings.   Electronically Signed   By: Rolla Flatten M.D.   On: 01/04/2014 20:23   Chest 2 View  01/06/2014   CLINICAL DATA:  Lung mass.  Preop.  EXAM: CHEST  2 VIEW  COMPARISON:  12/29/2013  FINDINGS: Unchanged appearance of left infrahilar mass with bilateral mediastinal lymphadenopathy. There are small bilateral pleural effusions, also seen previously. No edema or consolidation. Normal heart size. No acute osseous findings.  IMPRESSION: 1. Likely malignant left lung mass and lymphadenopathy. 2. Trace bilateral pleural effusions, unchanged from 12/29/2013.   Electronically Signed   By: Jorje Guild M.D.   On: 01/06/2014 07:16   Dg Chest 2 View  12/29/2013   CLINICAL DATA:  Cough, congestion, shortness of breath for 2 weeks, evaluate for pneumonia  EXAM: CHEST  2 VIEW  COMPARISON:  DG CHEST 1V PORT dated 09/03/2013; DG CHEST 2 VIEW dated 06/05/2012; DG CHEST 2 VIEW dated 06/07/2011  FINDINGS: Grossly unchanged borderline enlarged cardiac silhouette. Interval development of an approximately 4.9 x 4.2 cm apparent mass within the superior segment of the left lower lobe, best appreciated on the provided lateral radiograph. This finding is associated with apparent nodular thickening of the contralateral right paratracheal stripe. The lungs remain hyperexpanded with flattening of bilateral hemidiaphragms and mild diffuse slightly nodular thickening of the pulmonary interstitium. Interval development of trace bilateral pleural effusions. Worsening left basilar heterogeneous/consolidative opacities. No evidence of edema. No pneumothorax. Grossly unchanged bones.  IMPRESSION: 1. Interval development of an approximately 4.9 cm mass within the superior segment left lower lobe with possible adenopathy within in the contralateral right mediastinum. Further evaluation with contrast-enhanced chest CT is recommended. 2. Worsening left basilar/retrocardiac opacities, atelectasis versus infiltrate. 3. Interval development of  trace bilateral pleural effusions. No evidence of edema. These results will be called to the ordering clinician or representative by the Radiologist Assistant, and communication documented in the PACS Dashboard.   Electronically Signed   By: Sandi Mariscal M.D.   On: 12/29/2013 16:56   Ct Chest W Contrast  12/31/2013   CLINICAL DATA:  Abnormal chest radiograph with possible adenopathy, chest pain, shortness of breath.  EXAM: CT CHEST WITH CONTRAST  TECHNIQUE: Multidetector CT imaging of the chest was performed during intravenous contrast administration.  CONTRAST:  70mL OMNIPAQUE IOHEXOL 300 MG/ML  SOLN  COMPARISON:  Chest radiographs dated 12/29/2013.  FINDINGS: 5.0 x 4.4 cm central left lower lobe mass (series 3/image 39), suspicious for primary bronchogenic neoplasm. Mass extends into the left hilar region (series 2/ image 34). Narrowing of the left lower lobe bronchus.  Small right and trace left pleural effusions.  No pneumothorax.  Visualized thyroid is unremarkable.  The heart is normal in size. No pericardial effusion. Mild coronary atherosclerosis in the LAD. No evidence of  thoracic aortic aneurysm.  Associated mediastinal lymphadenopathy, including:  --8 mm short axis right supraclavicular node (series 2/image 9)  --20 mm short axis high right paratracheal node (series 2/image 21)  --13 mm short axis right hilar node (series 2/image 32)  --31 mm short axis subcarinal node (series 2/image 34)  Visualized upper abdomen is unremarkable. Bilateral adrenal glands are within normal limits.  Degenerative changes of the visualized thoracolumbar spine. Probable bone island at T12 (sagittal image 79).  IMPRESSION: 5.0 cm central left lower lobe mass, suspicious for primary bronchogenic neoplasm.  Associated thoracic lymphadenopathy, as described above, including a prominent 8 mm short axis right supraclavicular node.  Small right and trace left pleural effusions.  For tissue diagnosis, consider bronchoscopy or  percutaneous sampling of the right supraclavicular node.  These results will be called to the ordering clinician or representative by the Radiologist Assistant, and communication documented in the PACS Dashboard.   Electronically Signed   By: Julian Hy M.D.   On: 12/31/2013 14:36   Ct Angio Chest Pe W/cm &/or Wo Cm  01/10/2014   CLINICAL DATA:  Left lower lobe mass. Status post bronchoscopy and biopsy 2 days ago. Worsening shortness of breath. Hemoptysis.  EXAM: CT ANGIOGRAPHY CHEST WITH CONTRAST  TECHNIQUE: Multidetector CT imaging of the chest was performed using the standard protocol during bolus administration of intravenous contrast. Multiplanar CT image reconstructions and MIPs were obtained to evaluate the vascular anatomy.  CONTRAST:  70 mL OMNIPAQUE IOHEXOL 350 MG/ML SOLN  COMPARISON:  CT chest 12/31/2013. Chest in two views abdomen earlier this same day.  FINDINGS: There is a moderate right and small left pleural effusion. No pericardial effusion is identified. No pulmonary embolus is identified. The descending left interlobar and ascending right interlobar pulmonary arteries are narrowed by the patient's left lower lobe mass and mediastinal adenopathy.  Bulky mediastinal lymphadenopathy is unchanged. Again seen is a 2.0 cm right paratracheal node on image 28, a 3.1 cm subcarinal node on image 47 and a 2.7 cm left subcarinal lymph node on image 52. 5 cm in diameter left lower lobe mass is again identified. Increased compressive atelectasis is seen in the bases. There is a new nodular opacity in the right upper lobe measuring 1.1 x 0.8 cm on image 27. Incidentally imaged upper abdomen is unremarkable. No focal bony abnormality is identified.  Review of the MIP images confirms the above findings.  IMPRESSION: Negative for pulmonary embolus. Both the descending left interlobar and ascending right interlobar pulmonary arteries are compressed by lymphadenopathy and the left lower lobe mass.  No change  in bulky mediastinal lymphadenopathy and a left lower lobe pulmonary mass.  Some increase in a moderate right and small left pleural effusion.  1.1 cm nodular opacity right upper lobe is new since CT scan 2 weeks ago and indeterminate. Recommend attention on follow-up exams.   Electronically Signed   By: Inge Rise M.D.   On: 01/07/2014 14:33   Mr Jeri Cos [id number] Contrast  01/10/2014   CLINICAL DATA:  Metastatic melanoma.  EXAM: MRI HEAD WITHOUT AND WITH CONTRAST  TECHNIQUE: Multiplanar, multiecho pulse sequences of the brain and surrounding structures were obtained without and with intravenous contrast.  CONTRAST:  73mL MULTIHANCE GADOBENATE DIMEGLUMINE 529 MG/ML IV SOLN  COMPARISON:  Head CT 05/12/2005  FINDINGS: There is no acute infarct. There is no evidence of intracranial hemorrhage, mass, midline shift, or extra-axial fluid collection. There is no abnormal enhancement. There is mild generalized cerebral atrophy. Small,  scattered foci of T2 hyperintensity are present in the deep cerebral white matter bilaterally, nonspecific but compatible with mild chronic small vessel ischemic disease.  Orbits are unremarkable. Paranasal sinuses and mastoid air cells are clear. Major intracranial vascular flow voids are preserved. There is heterogeneous marrow signal at C2 involving the dens.  IMPRESSION: 1. No evidence of intracranial metastatic disease or acute intracranial abnormality. 2. Heterogeneous marrow signal in the dens, possibly degenerative although metastatic disease cannot be completely excluded. 3. Mild chronic small vessel ischemic disease.   Electronically Signed   By: Logan Bores   On: 01/10/2014 11:57   Dg Chest Port 1 View  01/10/2014   CLINICAL DATA:  Assess infiltrates.  EXAM: PORTABLE CHEST - 1 VIEW  COMPARISON:  DG CHEST 1V PORT dated 01/09/2014; CT ANGIO CHEST W/CM &/OR WO/CM dated 01/13/2014  FINDINGS: Stable enlargement of the mediastinum is consistent with lymphadenopathy. Increased densities  in the left lung could represent edema or layering pleural fluid. Persistent densities in the medial right lung base. Stable consolidation in the retrocardiac space.  IMPRESSION: Increased densities in the left lung could represent edema or layering fluid.  Persistent consolidation in the retrocardiac space.  Mediastinal lymphadenopathy.   Electronically Signed   By: Markus Daft M.D.   On: 01/10/2014 08:27   Dg Chest Port 1 View  01/09/2014   CLINICAL DATA:  Shortness of breath.  EXAM: PORTABLE CHEST - 1 VIEW  COMPARISON:  01/14/2014  FINDINGS: Chest radiograph demonstrates persistent densities in the medial left lower lung. Findings are compatible with volume loss and consolidation. Heart size is grossly stable. The trachea is midline. Again noted is widening of the mediastinum related to the known lymphadenopathy.  IMPRESSION: Persistent consolidation in the medial left lower lung.  Mediastinal lymphadenopathy.   Electronically Signed   By: Markus Daft M.D.   On: 01/09/2014 14:35   Dg Abd Acute W/chest  01/06/2014   CLINICAL DATA:  Shortness of breath.  Abdominal pain and distention.  EXAM: ACUTE ABDOMEN SERIES (ABDOMEN 2 VIEW & CHEST 1 VIEW)  COMPARISON:  CT chest 12/31/2013 and PA and lateral chest 01/06/2014.  FINDINGS: Single view of the chest demonstrates bulky mediastinal lymphadenopathy. Left lower lobe mass is seen as on the prior study. There are small bilateral pleural effusions. No pneumothorax.  Two views of the abdomen show no free intraperitoneal air. The stomach appears distended. There is no evidence of small bowel obstruction. Large volume of stool throughout the colon is noted.  IMPRESSION: Left lower lobe mass and mediastinal lymphadenopathy as seen on prior CT. Small bilateral pleural effusions are identified.  Negative for free intraperitoneal air or bowel obstruction.  Gaseous distention of the stomach.  Large volume of stool throughout the colon.   Electronically Signed   By: Inge Rise M.D.   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  Atrial fibrillation with RVR now back in NSR mildly tachycardic at 100bpm.  Not anticoagulated due to hemoptysis 2.  Mild acute renal failure now improved.   3.  HTN with poorly controlled BP 4.  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  Sepsis  PLAN:   1.  Change Cardizem SR to CD 180mg  daily and  increase as needed for sinus tachycardia and BP control 2.  Since renal function improved and BP markedly elevated will restart Lisinopril 20mg  daily (home dose) 3.  Follow renal function closely while restarting ACE I  Sueanne Margarita, MD  01/11/2014  9:38 AM

## 2014-01-11 NOTE — Progress Notes (Addendum)
[facility] PROGRESS NOTE  Andrew Church [id number] DOB: [birth year] DOA: 01/12/2014 PCP: Simona Huh, MD  Interim summary:  Patient is a 68 year old white male past medical history of distant melanoma who is having increased shortness of breath for several weeks and underwent lung biopsy after evaluation noted mediastinal adenopathy. 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 Patient admitted on 3/6 for syncopal episode. Workup in progress, however patient found to be clinically in sepsis with elevated lactic acid level, pro calcitonin level, anion gap and white blood cell count. Source unclear and covered prophylactically with antibiotics. Clinically getting better. Also found to be in new atrial fibrillation which has since converted to normal sinus rhythm. Critical care and cardiology following. Patient status post thoracentesis today.  Assessment/Plan: Active Problems:    Left Lower Lobe lung Mass: Secondary to metastatic melanoma   Shortness of breath: Secondary bilateral pleural effusions. Appreciate pulmonary assistance. Status post therapeutic/diagnostic thoracentesis yielding approximately 1 L. Echocardiogram unremarkable.  Steroids discontinued. Continue nebulizers.   hepatitis B: Reported previous history. Positive core level. Quantitative DNA levels notes marked elevation. We will discuss with gastroenterology, although likely outpatient treatment and in setting of metastatic melanoma, need to think about goals of care.    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 MRI of the brain notes no evidence of brain metastases. Will discuss with oncology    Abdominal distension: Secondary to constipation.   Unspecified constipation: Have started Colace and will give Fleet enema.    Syncope: Initial episode in emergency room and then again last night. No evidence of metastases. May been  secondary to hypoxia. No further episodes of syncope    Metabolic acidosis: Partially from lactic acidosis.. Unclear why. Critical care following. Lactic acid level better today, slowly improving.    Metabolic alkalosis    [id number]): Unclear. Urinalysis negative. Discussed with critical care. Anion gap today slowly improving. Pro calcitonin level normalizing. Could be bacterial translocation of the gut from constipation, although patient clinically looks okay. Blood cultures done-no growth to date and Zosyn started prophylactically. Could possibly be postobstructive pneumonia? Thoracentesis fluid unrevealing for empyema. White blood cell count elevated? From steroids. Slight vaginal discontinued today, so expect white blood cell count to improve by tomorrow    Atrial fibrillation with RVR: Rate controlled on Cardizem. Weaned off of Cardizem drip. Increase by mouth Cardizem. Back in sinus rhythm.  Heart rate should come down with fluid off   Code Status: Full code  Family Communication: Wife at the bedside earlier  Disposition Plan: Continue in stepdown until sepsis resolves   Consultants:  Cardiology  Pulmonary/CCM  Procedures:  Echocardiogram done 3/8: Unremarkable    Antibiotics:  IV Zosyn 3/7-present  HPI/Subjective: Pt feeling quite fatigued, chronic (for the last month) breathing about the same. Seen before thoracentesis.  Objective: Filed Vitals:   01/11/14 1600  BP: 154/91  Pulse:   Temp: 98 F (36.7 C)  Resp:     Intake/Output Summary (Last 24 hours) at 01/11/14 1857 Last data filed at 01/11/14 1800  Gross per 24 hour  Intake    630 ml  Output   1975 ml  Net  -1345 ml   Filed Weights   01/05/2014 1730  Weight: 86.2 kg (190 lb 0.6 oz)    Exam:   General:  A&O x3, minimal distress, secondary to dyspnea  Cardiovascular: RRR S1S2, borderline tachycardia  Respiratory: Decreasedbreath sounds bibasilar  Abdomen: soft, distended, NT,  hypoactive  bowel sounds  Musculoskeletal: Trace pitting edema bilaterally   Data Reviewed: Basic Metabolic Panel:  Recent Labs Lab 01/06/14 0718 01/09/2014 1150 01/09/14 0247 01/10/14 0343 01/11/14 0350  NA 140 141 137 136* 139  K 4.9 4.0 4.5 4.1 4.3  CL 101 102 96 94* 98  CO2 22 19 16* 17* 20  GLUCOSE 133* 235* 274* 213* 178*  BUN 39* 31* 31* 39* 48*  CREATININE 1.31 1.17 1.29 1.43* 1.29  CALCIUM 10.4 9.8 9.6 10.0 9.9  MG  --  2.1  --   --   --    Liver Function Tests:  Recent Labs Lab 01/06/14 0718 01/10/14 0343 01/11/14 0350 01/11/14 1610  AST 50* 46* 39*  --   ALT 31 24 23   --   ALKPHOS 128* 98 90  --   BILITOT 0.4 0.4 0.3  --   PROT 7.2 6.3 6.0 6.4  ALBUMIN 3.7 3.2* 3.1*  --    CBC:  Recent Labs Lab 01/06/14 0718 01/14/2014 1150 01/09/14 0247 01/10/14 1923 01/11/14 0350  WBC 11.2* 13.1* 15.8* 19.2* 19.9*  HGB 13.0 12.5* 11.7* 10.9* 10.9*  HCT 35.8* 34.5* 32.4* 30.7* 31.1*  MCV 87.1 86.5 86.2 86.5 86.6  PLT 125* 119* 110* 111* 104*   Cardiac Enzymes:  Recent Labs Lab 01/14/2014 1614 01/14/2014 2236 01/09/14 0247  TROPONINI <0.30 <0.30 <0.30   BNP (last 3 results)  Recent Labs  01/21/2014 1546 01/09/14 0248  PROBNP 305.4* 1296.0*   CBG:  Recent Labs Lab 01/10/14 1701 01/10/14 2121 01/11/14 0741 01/11/14 1144 01/11/14 1611  GLUCAP 177* 253* 176* 239* 166*    Recent Results (from the past 240 hour(s))  MRSA PCR SCREENING     Status: None   Collection Time    01/12/2014  8:50 PM      Result Value Ref Range Status   MRSA by PCR NEGATIVE  NEGATIVE Final   Comment:            The GeneXpert MRSA Assay (FDA     approved for NASAL specimens     only), is one component of a     comprehensive MRSA colonization     surveillance program. It is not     intended to diagnose MRSA     infection nor to guide or     monitor treatment for     MRSA infections.  CULTURE, BLOOD (ROUTINE X 2)     Status: None   Collection Time    01/09/14 12:00 PM      Result  Value Ref Range Status   Specimen Description BLOOD RIGHT HAND   Final   Special Requests BOTTLES DRAWN AEROBIC AND ANAEROBIC 10CC   Final   Culture  Setup Time     Final   Value: 01/09/2014 19:21     Performed at [facility]   Culture     Final   Value:        BLOOD CULTURE RECEIVED NO GROWTH TO DATE CULTURE WILL BE HELD FOR 5 DAYS BEFORE ISSUING A FINAL NEGATIVE REPORT     Performed at [facility]   Report Status PENDING   Incomplete  CULTURE, BLOOD (ROUTINE X 2)     Status: None   Collection Time    01/09/14 12:10 PM      Result Value Ref Range Status   Specimen Description BLOOD RIGHT ARM   Final   Special Requests BOTTLES DRAWN AEROBIC ONLY 10CC  Final   Culture  Setup Time     Final   Value: 01/09/2014 19:21     Performed at [facility]   Culture     Final   Value:        BLOOD CULTURE RECEIVED NO GROWTH TO DATE CULTURE WILL BE HELD FOR 5 DAYS BEFORE ISSUING A FINAL NEGATIVE REPORT     Performed at [facility]   Report Status PENDING   Incomplete     Studies: Mr Jeri Cos Wo Contrast  01/18/14   CLINICAL DATA:  Metastatic melanoma.  EXAM: MRI HEAD WITHOUT AND WITH CONTRAST  TECHNIQUE: Multiplanar, multiecho pulse sequences of the brain and surrounding structures were obtained without and with intravenous contrast.  CONTRAST:  65mL MULTIHANCE GADOBENATE DIMEGLUMINE 529 MG/ML IV SOLN  COMPARISON:  Head CT 05/12/2005  FINDINGS: There is no acute infarct. There is no evidence of intracranial hemorrhage, mass, midline shift, or extra-axial fluid collection. There is no abnormal enhancement. There is mild generalized cerebral atrophy. Small, scattered foci of T2 hyperintensity are present in the deep cerebral white matter bilaterally, nonspecific but compatible with mild chronic small vessel ischemic disease.  Orbits are unremarkable. Paranasal sinuses and mastoid air cells are clear. Major intracranial vascular flow voids are preserved. There is  heterogeneous marrow signal at C2 involving the dens.  IMPRESSION: 1. No evidence of intracranial metastatic disease or acute intracranial abnormality. 2. Heterogeneous marrow signal in the dens, possibly degenerative although metastatic disease cannot be completely excluded. 3. Mild chronic small vessel ischemic disease.   Electronically Signed   By: Logan Bores   On: 01/18/14 11:57   Dg Chest Port 1 View  01/11/2014   CLINICAL DATA:  Post thoracentesis  EXAM: PORTABLE CHEST - 1 VIEW  COMPARISON:  Portable exam 9373 hr compared to 01-18-14 ; correlation CT chest 01/16/14  FINDINGS: Normal heart size and pulmonary vascularity.  Prominent right peritracheal soft tissues and prominent left hilum corresponding to adenopathy on prior CT. Atelectasis versus consolidation left lower lobe.  Minimal right basilar atelectasis.  Persistent left pleural effusion.  No pneumothorax.  IMPRESSION: No pneumothorax following thoracentesis.  Persistent left pleural effusion and left lower lobe atelectasis versus consolidation.  Thoracic adenopathy.   Electronically Signed   By: Lavonia Dana M.D.   On: 01/11/2014 17:10   Dg Chest Port 1 View  2014/01/18   CLINICAL DATA:  Assess infiltrates.  EXAM: PORTABLE CHEST - 1 VIEW  COMPARISON:  DG CHEST 1V PORT dated 01/09/2014; CT ANGIO CHEST W/CM &/OR WO/CM dated 01-16-14  FINDINGS: Stable enlargement of the mediastinum is consistent with lymphadenopathy. Increased densities in the left lung could represent edema or layering pleural fluid. Persistent densities in the medial right lung base. Stable consolidation in the retrocardiac space.  IMPRESSION: Increased densities in the left lung could represent edema or layering fluid.  Persistent consolidation in the retrocardiac space.  Mediastinal lymphadenopathy.   Electronically Signed   By: Markus Daft M.D.   On: 2014-01-18 08:27    Scheduled Meds: . diltiazem  180 mg Oral Daily  . docusate sodium  100 mg Oral BID  . gabapentin  800  mg Oral TID  . gemfibrozil  600 mg Oral BID AC  . insulin aspart  0-20 Units Subcutaneous TID WC  . insulin aspart  0-5 Units Subcutaneous QHS  . insulin glargine  6 Units Subcutaneous QHS  . ipratropium-albuterol  3 mL Nebulization Q6H WA  . lisinopril  20 mg  Oral Daily  . piperacillin-tazobactam (ZOSYN)  IV  3.375 g Intravenous 3 times per day  . polyethylene glycol  17 g Oral Daily  . pregabalin  75 mg Oral BID  . senna-docusate  2 tablet Oral QHS  . 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 If 7PM-7AM, please contact night-coverage at [email or web address], password [facility] 01/11/2014, 6:57 PM  LOS: 3 days

## 2014-01-12 ENCOUNTER — Encounter (HOSPITAL_COMMUNITY): Payer: Self-pay | Admitting: *Deleted

## 2014-01-12 ENCOUNTER — Inpatient Hospital Stay (HOSPITAL_COMMUNITY): Payer: BC Managed Care – PPO

## 2014-01-12 DIAGNOSIS — E785 Hyperlipidemia, unspecified: Secondary | ICD-10-CM

## 2014-01-12 DIAGNOSIS — G589 Mononeuropathy, unspecified: Secondary | ICD-10-CM

## 2014-01-12 DIAGNOSIS — R0609 Other forms of dyspnea: Secondary | ICD-10-CM

## 2014-01-12 DIAGNOSIS — R7309 Other abnormal glucose: Secondary | ICD-10-CM

## 2014-01-12 DIAGNOSIS — A419 Sepsis, unspecified organism: Secondary | ICD-10-CM | POA: Diagnosis not present

## 2014-01-12 DIAGNOSIS — J9600 Acute respiratory failure, unspecified whether with hypoxia or hypercapnia: Secondary | ICD-10-CM | POA: Diagnosis not present

## 2014-01-12 DIAGNOSIS — C4359 Malignant melanoma of other part of trunk: Secondary | ICD-10-CM

## 2014-01-12 DIAGNOSIS — J90 Pleural effusion, not elsewhere classified: Secondary | ICD-10-CM

## 2014-01-12 DIAGNOSIS — I10 Essential (primary) hypertension: Secondary | ICD-10-CM

## 2014-01-12 DIAGNOSIS — I4891 Unspecified atrial fibrillation: Secondary | ICD-10-CM

## 2014-01-12 DIAGNOSIS — R0989 Other specified symptoms and signs involving the circulatory and respiratory systems: Secondary | ICD-10-CM

## 2014-01-12 DIAGNOSIS — R222 Localized swelling, mass and lump, trunk: Secondary | ICD-10-CM

## 2014-01-12 LAB — MRSA PCR SCREENING: MRSA BY PCR: NEGATIVE

## 2014-01-12 LAB — BASIC METABOLIC PANEL
BUN: 50 mg/dL — ABNORMAL HIGH (ref 6–23)
CO2: 23 meq/L (ref 19–32)
Calcium: 9.7 mg/dL (ref 8.4–10.5)
Chloride: 98 mEq/L (ref 96–112)
Creatinine, Ser: 1.26 mg/dL (ref 0.50–1.35)
GFR calc Af Amer: 65 mL/min — ABNORMAL LOW (ref >90)
GFR, EST NON AFRICAN AMERICAN: 57 mL/min — AB (ref >90)
Glucose, Bld: 137 mg/dL — ABNORMAL HIGH (ref 70–99)
POTASSIUM: 4.5 meq/L (ref 3.7–5.3)
SODIUM: 139 meq/L (ref 137–147)

## 2014-01-12 LAB — CBC
HCT: 31.7 % — ABNORMAL LOW (ref 39.0–52.0)
Hemoglobin: 11.1 g/dL — ABNORMAL LOW (ref 13.0–17.0)
MCH: 30.6 pg (ref 26.0–34.0)
MCHC: 35 g/dL (ref 30.0–36.0)
MCV: 87.3 fL (ref 78.0–100.0)
PLATELETS: 103 10*3/uL — AB (ref 150–400)
RBC: 3.63 MIL/uL — ABNORMAL LOW (ref 4.22–5.81)
RDW: 13.1 % (ref 11.5–15.5)
WBC: 18 10*3/uL — ABNORMAL HIGH (ref 4.0–10.5)

## 2014-01-12 LAB — PH, BODY FLUID: PH, FLUID: 8.5

## 2014-01-12 LAB — GLUCOSE, CAPILLARY
GLUCOSE-CAPILLARY: 137 mg/dL — AB (ref 70–99)
Glucose-Capillary: 147 mg/dL — ABNORMAL HIGH (ref 70–99)
Glucose-Capillary: 100 mg/dL — ABNORMAL HIGH (ref 70–99)
Glucose-Capillary: 130 mg/dL — ABNORMAL HIGH (ref 70–99)
Glucose-Capillary: 146 mg/dL — ABNORMAL HIGH (ref 70–99)

## 2014-01-12 MED ORDER — FUROSEMIDE 10 MG/ML IJ SOLN
20.0000 mg | Freq: Two times a day (BID) | INTRAMUSCULAR | Status: DC
  Administered 2014-01-12 – 2014-01-15 (×6): 20 mg via INTRAVENOUS
  Filled 2014-01-12 (×8): qty 2

## 2014-01-12 MED ORDER — LISINOPRIL 10 MG PO TABS
10.0000 mg | ORAL_TABLET | Freq: Once | ORAL | Status: AC
  Administered 2014-01-12: 10 mg via ORAL
  Filled 2014-01-12: qty 1

## 2014-01-12 MED ORDER — PANTOPRAZOLE SODIUM 40 MG IV SOLR
40.0000 mg | Freq: Every day | INTRAVENOUS | Status: DC
  Administered 2014-01-12 – 2014-01-14 (×4): 40 mg via INTRAVENOUS
  Filled 2014-01-12 (×6): qty 40

## 2014-01-12 MED ORDER — DILTIAZEM HCL ER COATED BEADS 120 MG PO CP24
120.0000 mg | ORAL_CAPSULE | Freq: Every day | ORAL | Status: DC
  Filled 2014-01-12 (×3): qty 1

## 2014-01-12 MED ORDER — ALBUTEROL SULFATE (2.5 MG/3ML) 0.083% IN NEBU: 2.5000 mg | INHALATION_SOLUTION | Freq: Once | RESPIRATORY_TRACT | Status: DC

## 2014-01-12 MED ORDER — FUROSEMIDE 10 MG/ML IJ SOLN
INTRAMUSCULAR | Status: AC
  Filled 2014-01-12: qty 2

## 2014-01-12 MED ORDER — FUROSEMIDE 10 MG/ML IJ SOLN: 20.0000 mg | Freq: Two times a day (BID) | INTRAMUSCULAR | Status: DC

## 2014-01-12 MED ORDER — MORPHINE SULFATE 2 MG/ML IJ SOLN
2.0000 mg | INTRAMUSCULAR | Status: DC | PRN
  Administered 2014-01-12 (×2): 2 mg via INTRAVENOUS
  Filled 2014-01-12 (×2): qty 1

## 2014-01-12 MED ORDER — MORPHINE SULFATE 2 MG/ML IJ SOLN
2.0000 mg | INTRAMUSCULAR | Status: DC | PRN
  Administered 2014-01-12 (×2): 2 mg via INTRAVENOUS
  Filled 2014-01-12: qty 1

## 2014-01-12 MED ORDER — MORPHINE SULFATE 10 MG/ML IJ SOLN
2.0000 mg/h | INTRAVENOUS | Status: DC
  Administered 2014-01-12 – 2014-01-15 (×4): 2 mg/h via INTRAVENOUS
  Filled 2014-01-12 (×4): qty 10

## 2014-01-12 MED ORDER — LEVALBUTEROL HCL 1.25 MG/0.5ML IN NEBU
1.2500 mg | INHALATION_SOLUTION | Freq: Four times a day (QID) | RESPIRATORY_TRACT | Status: DC | PRN
  Filled 2014-01-12: qty 0.5

## 2014-01-12 MED ORDER — OXYCODONE HCL ER 10 MG PO T12A: 20.0000 mg | EXTENDED_RELEASE_TABLET | Freq: Two times a day (BID) | ORAL | Status: DC

## 2014-01-12 MED ORDER — OXYCODONE HCL 5 MG PO TABS
5.0000 mg | ORAL_TABLET | ORAL | Status: DC | PRN
  Administered 2014-01-12: 5 mg via ORAL
  Filled 2014-01-12: qty 1

## 2014-01-12 NOTE — Progress Notes (Signed)
SUBJECTIVE:  Very agitated and SOB  OBJECTIVE:   Vitals:   Filed Vitals:   01/12/14 0031 01/12/14 0622 01/12/14 0735 01/12/14 0923  BP:  199/89 135/58   Pulse:      Temp:  98.1 F (36.7 C) 97.8 F (36.6 C)   TempSrc:  Oral Oral   Resp:  22    Height:      Weight:      SpO2: 96% 100% 93% 96%   I&O's:   Intake/Output Summary (Last 24 hours) at 01/12/14 [id number] Last data filed at 01/12/14 0700  Gross per 24 hour  Intake    340 ml  Output   1965 ml  Net  -1625 ml   TELEMETRY: Reviewed telemetry pt in sinus tachycardia     PHYSICAL EXAM General:very anxious and SOB Head: Eyes PERRLA, No xanthomas.   Normal cephalic and atramatic  Lungs:   Decreased BS throughout with severe wheezing Heart:   HRRR S1 S2 Pulses are 2+ & equal. Abdomen: Bowel sounds are positive, abdomen soft and non-tender without masses  Extremities:   No clubbing, cyanosis or edema.  DP +1 Neuro: Alert and oriented X 3. Psych:  Very agitated, responds appropriately   LABS: Basic Metabolic Panel:  Recent Labs  01/11/14 0350 01/12/14 0616  NA 139 139  K 4.3 4.5  CL 98 98  CO2 20 23  GLUCOSE 178* 137*  BUN 48* 50*  CREATININE 1.29 1.26  CALCIUM 9.9 9.7   Liver Function Tests:  Recent Labs  01/10/14 0343 01/11/14 0350 01/11/14 1610  AST 46* 39*  --   ALT 24 23  --   ALKPHOS 98 90  --   BILITOT 0.4 0.3  --   PROT 6.3 6.0 6.4  ALBUMIN 3.2* 3.1*  --    No results found for this basename: LIPASE, AMYLASE,  in the last 72 hours CBC:  Recent Labs  01/11/14 0350 01/12/14 0616  WBC 19.9* 18.0*  HGB 10.9* 11.1*  HCT 31.1* 31.7*  MCV 86.6 87.3  PLT 104* 103*   Cardiac Enzymes: No results found for this basename: CKTOTAL, CKMB, CKMBINDEX, TROPONINI,  in the last 72 hours BNP: No components found with this basename: POCBNP,  D-Dimer: No results found for this basename: DDIMER,  in the last 72 hours Hemoglobin A1C:  Recent Labs  01/09/14 2052  HGBA1C 6.2*   Fasting Lipid  Panel:  Recent Labs  01/11/14 1610  CHOL 172   Thyroid Function Tests: No results found for this basename: TSH, T4TOTAL, FREET3, T3FREE, THYROIDAB,  in the last 72 hours Anemia Panel: No results found for this basename: VITAMINB12, FOLATE, FERRITIN, TIBC, IRON, RETICCTPCT,  in the last 72 hours Coag Panel:   Lab Results  Component Value Date   INR 1.09 01/06/2014   INR 1.04 09/03/2013    RADIOLOGY: Ct Abdomen Pelvis Wo Contrast  01/20/2014   CLINICAL DATA:  Constipation. Hepatitis B. 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  EXAM: CT ABDOMEN AND PELVIS WITHOUT CONTRAST  TECHNIQUE: Multidetector CT imaging of the abdomen and pelvis was performed following the standard protocol without intravenous contrast.  COMPARISON:  DG ABD ACUTE W/CHEST dated 01/12/2014  FINDINGS: Unchanged chest findings of mediastinal adenopathy, bilateral pleural effusions, and left lower lobe mass. Hepatic steatosis. Normal spleen, gallbladder, pancreas, and kidneys.  8 x 16 mm left adrenal nodularity, nonspecific, but changed from 2007, not visibly hypervascular, metastasis versus small adenoma (favored) are considerations.  Normal kidneys ureters, and bladder. No retroperitoneal adenopathy.  No bowel obstruction or bowel wall thickening. Moderate gastric distention of a gaseous nature, but no visible outlet obstruction. Moderate stool burden. No osseous findings. Chronic bone island in T12. Superficial soft tissues unremarkable. No pathologically enlarged inguinal adenopathy or hernia. No appendiceal inflammation. Mild prostate enlargement.  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  8 x 16 mm left adrenal nodularity, nonspecific, but interval change since 2007.  No acute intra-abdominal findings.   Electronically Signed   By: Rolla Flatten M.D.   On: 01/28/2014 20:23   Chest 2 View  01/06/2014   CLINICAL DATA:  Lung mass.  Preop.  EXAM: CHEST  2 VIEW  COMPARISON:   12/29/2013  FINDINGS: Unchanged appearance of left infrahilar mass with bilateral mediastinal lymphadenopathy. There are small bilateral pleural effusions, also seen previously. No edema or consolidation. Normal heart size. No acute osseous findings.  IMPRESSION: 1. Likely malignant left lung mass and lymphadenopathy. 2. Trace bilateral pleural effusions, unchanged from 12/29/2013.   Electronically Signed   By: Jorje Guild M.D.   On: 01/06/2014 07:16   Dg Chest 2 View  12/29/2013   CLINICAL DATA:  Cough, congestion, shortness of breath for 2 weeks, evaluate for pneumonia  EXAM: CHEST  2 VIEW  COMPARISON:  DG CHEST 1V PORT dated 09/03/2013; DG CHEST 2 VIEW dated 06/05/2012; DG CHEST 2 VIEW dated 06/07/2011  FINDINGS: Grossly unchanged borderline enlarged cardiac silhouette. Interval development of an approximately 4.9 x 4.2 cm apparent mass within the superior segment of the left lower lobe, best appreciated on the provided lateral radiograph. This finding is associated with apparent nodular thickening of the contralateral right paratracheal stripe. The lungs remain hyperexpanded with flattening of bilateral hemidiaphragms and mild diffuse slightly nodular thickening of the pulmonary interstitium. Interval development of trace bilateral pleural effusions. Worsening left basilar heterogeneous/consolidative opacities. No evidence of edema. No pneumothorax. Grossly unchanged bones.  IMPRESSION: 1. Interval development of an approximately 4.9 cm mass within the superior segment left lower lobe with possible adenopathy within in the contralateral right mediastinum. Further evaluation with contrast-enhanced chest CT is recommended. 2. Worsening left basilar/retrocardiac opacities, atelectasis versus infiltrate. 3. Interval development of trace bilateral pleural effusions. No evidence of edema. These results will be called to the ordering clinician or representative by the Radiologist Assistant, and communication  documented in the PACS Dashboard.   Electronically Signed   By: Sandi Mariscal M.D.   On: 12/29/2013 16:56   Ct Chest W Contrast  12/31/2013   CLINICAL DATA:  Abnormal chest radiograph with possible adenopathy, chest pain, shortness of breath.  EXAM: CT CHEST WITH CONTRAST  TECHNIQUE: Multidetector CT imaging of the chest was performed during intravenous contrast administration.  CONTRAST:  63mL OMNIPAQUE IOHEXOL 300 MG/ML  SOLN  COMPARISON:  Chest radiographs dated 12/29/2013.  FINDINGS: 5.0 x 4.4 cm central left lower lobe mass (series 3/image 39), suspicious for primary bronchogenic neoplasm. Mass extends into the left hilar region (series 2/ image 34). Narrowing of the left lower lobe bronchus.  Small right and trace left pleural effusions.  No pneumothorax.  Visualized thyroid is unremarkable.  The heart is normal in size. No pericardial effusion. Mild coronary atherosclerosis in the LAD. No evidence of thoracic aortic aneurysm.  Associated mediastinal lymphadenopathy, including:  --8 mm short axis right supraclavicular node (series 2/image 9)  --20 mm short axis high right paratracheal node (series 2/image 21)  --13 mm short axis right hilar node (series 2/image 32)  --31 mm  short axis subcarinal node (series 2/image 34)  Visualized upper abdomen is unremarkable. Bilateral adrenal glands are within normal limits.  Degenerative changes of the visualized thoracolumbar spine. Probable bone island at T12 (sagittal image 79).  IMPRESSION: 5.0 cm central left lower lobe mass, suspicious for primary bronchogenic neoplasm.  Associated thoracic lymphadenopathy, as described above, including a prominent 8 mm short axis right supraclavicular node.  Small right and trace left pleural effusions.  For tissue diagnosis, consider bronchoscopy or percutaneous sampling of the right supraclavicular node.  These results will be called to the ordering clinician or representative by the Radiologist Assistant, and communication  documented in the PACS Dashboard.   Electronically Signed   By: Julian Hy M.D.   On: 12/31/2013 14:36   Ct Angio Chest Pe W/cm &/or Wo Cm  01/30/2014   CLINICAL DATA:  Left lower lobe mass. Status post bronchoscopy and biopsy 2 days ago. Worsening shortness of breath. Hemoptysis.  EXAM: CT ANGIOGRAPHY CHEST WITH CONTRAST  TECHNIQUE: Multidetector CT imaging of the chest was performed using the standard protocol during bolus administration of intravenous contrast. Multiplanar CT image reconstructions and MIPs were obtained to evaluate the vascular anatomy.  CONTRAST:  70 mL OMNIPAQUE IOHEXOL 350 MG/ML SOLN  COMPARISON:  CT chest 12/31/2013. Chest in two views abdomen earlier this same day.  FINDINGS: There is a moderate right and small left pleural effusion. No pericardial effusion is identified. No pulmonary embolus is identified. The descending left interlobar and ascending right interlobar pulmonary arteries are narrowed by the patient's left lower lobe mass and mediastinal adenopathy.  Bulky mediastinal lymphadenopathy is unchanged. Again seen is a 2.0 cm right paratracheal node on image 28, a 3.1 cm subcarinal node on image 47 and a 2.7 cm left subcarinal lymph node on image 52. 5 cm in diameter left lower lobe mass is again identified. Increased compressive atelectasis is seen in the bases. There is a new nodular opacity in the right upper lobe measuring 1.1 x 0.8 cm on image 27. Incidentally imaged upper abdomen is unremarkable. No focal bony abnormality is identified.  Review of the MIP images confirms the above findings.  IMPRESSION: Negative for pulmonary embolus. Both the descending left interlobar and ascending right interlobar pulmonary arteries are compressed by lymphadenopathy and the left lower lobe mass.  No change in bulky mediastinal lymphadenopathy and a left lower lobe pulmonary mass.  Some increase in a moderate right and small left pleural effusion.  1.1 cm nodular opacity right upper  lobe is new since CT scan 2 weeks ago and indeterminate. Recommend attention on follow-up exams.   Electronically Signed   By: Inge Rise M.D.   On: 2014/01/30 14:33   Mr Jeri Cos KG Contrast  01/10/2014   CLINICAL DATA:  Metastatic melanoma.  EXAM: MRI HEAD WITHOUT AND WITH CONTRAST  TECHNIQUE: Multiplanar, multiecho pulse sequences of the brain and surrounding structures were obtained without and with intravenous contrast.  CONTRAST:  34mL MULTIHANCE GADOBENATE DIMEGLUMINE 529 MG/ML IV SOLN  COMPARISON:  Head CT 05/12/2005  FINDINGS: There is no acute infarct. There is no evidence of intracranial hemorrhage, mass, midline shift, or extra-axial fluid collection. There is no abnormal enhancement. There is mild generalized cerebral atrophy. Small, scattered foci of T2 hyperintensity are present in the deep cerebral white matter bilaterally, nonspecific but compatible with mild chronic small vessel ischemic disease.  Orbits are unremarkable. Paranasal sinuses and mastoid air cells are clear. Major intracranial vascular flow voids are preserved. There is  heterogeneous marrow signal at C2 involving the dens.  IMPRESSION: 1. No evidence of intracranial metastatic disease or acute intracranial abnormality. 2. Heterogeneous marrow signal in the dens, possibly degenerative although metastatic disease cannot be completely excluded. 3. Mild chronic small vessel ischemic disease.   Electronically Signed   By: Logan Bores   On: 01/10/2014 11:57   Dg Chest Port 1 View  01/12/2014   CLINICAL DATA:  Respiratory distress.  EXAM: PORTABLE CHEST - 1 VIEW  COMPARISON:  DG CHEST 1V PORT dated 01/11/2014; CT ANGIO CHEST W/CM &/OR WO/CM dated 01/24/2014  FINDINGS: Cardiac silhouette appears mildly enlarged, similar prominent mediastinal silhouette. Slightly decreased central pulmonary vasculature congestion with persistent left lung base airspace opacity and small left pleural effusion. No pneumothorax.  Multiple EKG lines overlie  the patient and may obscure subtle underlying pathology. Mild degenerative change of the thoracic spine. Gaseous distended stomach.  IMPRESSION: Mild cardiomegaly, slightly decreased central pulmonary vasculature congestion with persistent left lung base airspace opacity and small pleural effusion.  Mildly prominent mediastinal silhouette corresponding to known lymphadenopathy.   Electronically Signed   By: Elon Alas   On: 01/12/2014 01:28   Dg Chest Port 1 View  01/11/2014   CLINICAL DATA:  Post thoracentesis  EXAM: PORTABLE CHEST - 1 VIEW  COMPARISON:  Portable exam [id number] hr compared to 01/10/2014 ; correlation CT chest 01/16/2014  FINDINGS: Normal heart size and pulmonary vascularity.  Prominent right peritracheal soft tissues and prominent left hilum corresponding to adenopathy on prior CT. Atelectasis versus consolidation left lower lobe.  Minimal right basilar atelectasis.  Persistent left pleural effusion.  No pneumothorax.  IMPRESSION: No pneumothorax following thoracentesis.  Persistent left pleural effusion and left lower lobe atelectasis versus consolidation.  Thoracic adenopathy.   Electronically Signed   By: Lavonia Dana M.D.   On: 01/11/2014 17:10   Dg Chest Port 1 View  01/10/2014   CLINICAL DATA:  Assess infiltrates.  EXAM: PORTABLE CHEST - 1 VIEW  COMPARISON:  DG CHEST 1V PORT dated 01/09/2014; CT ANGIO CHEST W/CM &/OR WO/CM dated 01/14/2014  FINDINGS: Stable enlargement of the mediastinum is consistent with lymphadenopathy. Increased densities in the left lung could represent edema or layering pleural fluid. Persistent densities in the medial right lung base. Stable consolidation in the retrocardiac space.  IMPRESSION: Increased densities in the left lung could represent edema or layering fluid.  Persistent consolidation in the retrocardiac space.  Mediastinal lymphadenopathy.   Electronically Signed   By: Markus Daft M.D.   On: 01/10/2014 08:27   Dg Chest Port 1 View  01/09/2014   CLINICAL  DATA:  Shortness of breath.  EXAM: PORTABLE CHEST - 1 VIEW  COMPARISON:  01/22/2014  FINDINGS: Chest radiograph demonstrates persistent densities in the medial left lower lung. Findings are compatible with volume loss and consolidation. Heart size is grossly stable. The trachea is midline. Again noted is widening of the mediastinum related to the known lymphadenopathy.  IMPRESSION: Persistent consolidation in the medial left lower lung.  Mediastinal lymphadenopathy.   Electronically Signed   By: Markus Daft M.D.   On: 01/09/2014 14:35   Dg Abd Acute W/chest  01/14/2014   CLINICAL DATA:  Shortness of breath.  Abdominal pain and distention.  EXAM: ACUTE ABDOMEN SERIES (ABDOMEN 2 VIEW & CHEST 1 VIEW)  COMPARISON:  CT chest 12/31/2013 and PA and lateral chest 01/06/2014.  FINDINGS: Single view of the chest demonstrates bulky mediastinal lymphadenopathy. Left lower lobe mass is seen as on the  prior study. There are small bilateral pleural effusions. No pneumothorax.  Two views of the abdomen show no free intraperitoneal air. The stomach appears distended. There is no evidence of small bowel obstruction. Large volume of stool throughout the colon is noted.  IMPRESSION: Left lower lobe mass and mediastinal lymphadenopathy as seen on prior CT. Small bilateral pleural effusions are identified.  Negative for free intraperitoneal air or bowel obstruction.  Gaseous distention of the stomach.  Large volume of stool throughout the colon.   Electronically Signed   By: Inge Rise M.D.   On: 01/13/2014 13:51   ASSESSMENT:  1. Atrial fibrillation with RVR now back in NSR mildly tachycardic at 100bpm. Not anticoagulated due to hemoptysis  2. Mild acute renal failure now improved.  3. HTN still with poorly controlled BP  4. 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 Sepsis   PLAN:  1. Continue Cardizem CD 240mg  daily - I think his borderline sinus tach at this time is more related to anxiety and nebs he is receiving for  wheezing 2. Continue Lisinopril 20mg  daily (home dose) - may need to increase dose if BP remains elevated 3. Follow renal function closely while restarting ACE I         Andrew Margarita, MD  01/12/2014  9:38 AM

## 2014-01-12 NOTE — Progress Notes (Addendum)
[facility] Progress Note [place] TEAM 1 - Stepdown/ICU TEAM   DAMARIOUS GOODGION [id number] DOB: 15-Dec-1945 DOA: 01/06/2014 PCP: Simona Huh, MD  Brief narrative: Andrew Church is a 68 y.o. male presenting on 02/02/2014 with  has a past medical history of Neuropathy; Prediabetes; HTN (hypertension); Hyperlipemia; Melanoma; cardiovascular stress test; Fatty liver; Diabetes mellitus; ED (erectile dysfunction); Hepatitis B carrier; CKD (chronic kidney disease), stage III; and PAF (paroxysmal atrial fibrillation) who presents with dyspnea. He underwent a lung biopsy for increasing dyspnea and hemoptysis a few wks ago by Dr Roxy Horseman and was found to have metastatic melanoma.  He was admitted as mentioned for increasing dyspnea, wheezing, coughing. He had svt in the ER and syncope (see H&P). He had lactic acidosis with anion gap acidosis. He was evaluated by PCCM on 3/9 and underwent a right sided paracentesis with removal of 1 L for symptom relief, however, this was ineffective in relieving symptoms. Dr Earnest Conroy feels that the respiratory distress is coming from airway narrowing from the tumor and has discussed irradiating it with Rad Onc. They have agreed to it and the patient has been transferred to [facility] in preparation for this. His oncologist will see him there as well.  Of note, the patient has Hep B and Viral DNA is noted to be elevated.   Subjective: Quite agitated and sleepy at the same time. Initially asleep when I entered the room. Later asks to sit up. Have had long discussion with wife with Dr Earnest Conroy. She has agreed to not pursure life support or other extreme measures to keep him alive although, previously, the patient did want to be a full code.   Assessment/Plan: Principal Problem:   Acute respiratory failure - due to metastatic melanoma - details as above, PCCM following - starting low dose Oxycodone for generalized pain air hunger- if it helps, can start Oxycontin to help him rest at  night.   Active Problems:   Melanoma- metastatic - Per Onc    Abdominal distension/ Unspecified constipation - cont to treat as he will now be on narcotics     Syncope - in ER - due to SVT? No brain mets    Metabolic acidosis - lactic acidosis    Sepsis- - I do not see a documented fever but procalcitonin and WBC count high - question bacterial translocation from severe constipation - also note that pt was on steroids which were discontinued on 3/9    Atrial fibrillation with RVR - cardiology avoiding anticoagulation due to cancer masses and pervious hemoptysis - oral cardizem per cardiology    ARF (acute renal failure) - resolved    Hepatitis B - d/w GI - this is active hep B- recommended to add E antigen and antibody  HTN - ACE, Cardizem    Code Status: DNR Family Communication: with wife Disposition Plan: to be determined  Consultants: PCCM Rad Onc Heme/Onc Cardiology  Procedures: 3/9- right sided thoracentesis  Antibiotics: Antibiotics Given (last 72 hours)   Date/Time Action Medication Dose Rate   01/09/14 2102 Given   piperacillin-tazobactam (ZOSYN) IVPB 3.375 g 3.375 g 12.5 mL/hr   01/10/14 0555 Given   piperacillin-tazobactam (ZOSYN) IVPB 3.375 g 3.375 g 12.5 mL/hr   01/10/14 1312 Given   piperacillin-tazobactam (ZOSYN) IVPB 3.375 g 3.375 g 12.5 mL/hr   01/10/14 2119 Given   piperacillin-tazobactam (ZOSYN) IVPB 3.375 g 3.375 g 12.5 mL/hr   01/11/14 0520 Given   piperacillin-tazobactam (ZOSYN) IVPB 3.375 g 3.375 g 12.5 mL/hr  01/11/14 1407 Given   piperacillin-tazobactam (ZOSYN) IVPB 3.375 g 3.375 g 12.5 mL/hr   01/11/14 2230 Given   piperacillin-tazobactam (ZOSYN) IVPB 3.375 g 3.375 g 12.5 mL/hr   01/12/14 8676 Given   piperacillin-tazobactam (ZOSYN) IVPB 3.375 g 3.375 g 12.5 mL/hr   01/12/14 1340 Given   piperacillin-tazobactam (ZOSYN) IVPB 3.375 g 3.375 g 12.5 mL/hr       DVT prophylaxis: SCDs  Objective: Filed Weights    01/25/2014 1730  Weight: 86.2 kg (190 lb 0.6 oz)   Blood pressure 165/87, pulse 104, temperature 97.8 F (36.6 C), temperature source Oral, resp. rate 22, height 5' 10.87" (1.8 m), weight 86.2 kg (190 lb 0.6 oz), SpO2 95.00%.  Intake/Output Summary (Last 24 hours) at 01/12/14 1613 Last data filed at 01/12/14 1314  Gross per 24 hour  Intake    770 ml  Output   1890 ml  Net  -1120 ml     Exam: General: + acute respiratory distress with abdominal breathing  Lungs: upper airway wheeze and ronchi Cardiovascular: Regular rate and rhythm without murmur gallop or rub normal S1 and S2 Abdomen: Nontender, nondistended, soft, bowel sounds positive, no rebound, no ascites, no appreciable mass Extremities: No significant cyanosis, clubbing, or edema bilateral lower extremities  Data Reviewed: Basic Metabolic Panel:  Recent Labs Lab 01/06/14 0718 01/25/2014 1150 01/09/14 0247 01/10/14 0343 01/11/14 0350 01/12/14 0616  NA 140 141 137 136* 139 139  K 4.9 4.0 4.5 4.1 4.3 4.5  CL 101 102 96 94* 98 98  CO2 22 19 16* 17* 20 23  GLUCOSE 133* 235* 274* 213* 178* 137*  BUN 39* 31* 31* 39* 48* 50*  CREATININE 1.31 1.17 1.29 1.43* 1.29 1.26  CALCIUM 10.4 9.8 9.6 10.0 9.9 9.7  MG  --  2.1  --   --   --   --    Liver Function Tests:  Recent Labs Lab 01/06/14 0718 01/10/14 0343 01/11/14 0350 01/11/14 1610  AST 50* 46* 39*  --   ALT 31 24 23   --   ALKPHOS 128* 98 90  --   BILITOT 0.4 0.4 0.3  --   PROT 7.2 6.3 6.0 6.4  ALBUMIN 3.7 3.2* 3.1*  --    No results found for this basename: LIPASE, AMYLASE,  in the last 168 hours No results found for this basename: AMMONIA,  in the last 168 hours CBC:  Recent Labs Lab 01/05/2014 1150 01/09/14 0247 01/10/14 1923 01/11/14 0350 01/12/14 0616  WBC 13.1* 15.8* 19.2* 19.9* 18.0*  HGB 12.5* 11.7* 10.9* 10.9* 11.1*  HCT 34.5* 32.4* 30.7* 31.1* 31.7*  MCV 86.5 86.2 86.5 86.6 87.3  PLT 119* 110* 111* 104* 103*   Cardiac Enzymes:  Recent  Labs Lab 01/07/2014 1614 01/29/2014 2236 01/09/14 0247  TROPONINI <0.30 <0.30 <0.30   BNP (last 3 results)  Recent Labs  01/03/2014 1546 01/09/14 0248  PROBNP 305.4* 1296.0*   CBG:  Recent Labs Lab 01/11/14 1611 01/11/14 2230 01/12/14 0731 01/12/14 1229 01/12/14 1412  GLUCAP 166* 169* 147* 100* 130*    Recent Results (from the past 240 hour(s))  MRSA PCR SCREENING     Status: None   Collection Time    01/23/2014  8:50 PM      Result Value Ref Range Status   MRSA by PCR NEGATIVE  NEGATIVE Final   Comment:            The GeneXpert MRSA Assay (FDA     approved for NASAL  specimens     only), is one component of a     comprehensive MRSA colonization     surveillance program. It is not     intended to diagnose MRSA     infection nor to guide or     monitor treatment for     MRSA infections.  CULTURE, BLOOD (ROUTINE X 2)     Status: None   Collection Time    01/09/14 12:00 PM      Result Value Ref Range Status   Specimen Description BLOOD RIGHT HAND   Final   Special Requests BOTTLES DRAWN AEROBIC AND ANAEROBIC 10CC   Final   Culture  Setup Time     Final   Value: 01/09/2014 19:21     Performed at [facility]   Culture     Final   Value:        BLOOD CULTURE RECEIVED NO GROWTH TO DATE CULTURE WILL BE HELD FOR 5 DAYS BEFORE ISSUING A FINAL NEGATIVE REPORT     Performed at [facility]   Report Status PENDING   Incomplete  CULTURE, BLOOD (ROUTINE X 2)     Status: None   Collection Time    01/09/14 12:10 PM      Result Value Ref Range Status   Specimen Description BLOOD RIGHT ARM   Final   Special Requests BOTTLES DRAWN AEROBIC ONLY 10CC   Final   Culture  Setup Time     Final   Value: 01/09/2014 19:21     Performed at [facility]   Culture     Final   Value:        BLOOD CULTURE RECEIVED NO GROWTH TO DATE CULTURE WILL BE HELD FOR 5 DAYS BEFORE ISSUING A FINAL NEGATIVE REPORT     Performed at [facility]   Report Status PENDING    Incomplete  AFB CULTURE WITH SMEAR     Status: None   Collection Time    01/11/14  2:53 PM      Result Value Ref Range Status   Specimen Description FLUID RIGHT PLEURAL   Final   Special Requests Normal   Final   ACID FAST SMEAR     Final   Value: NO ACID FAST BACILLI SEEN     Performed at [facility]   Culture     Final   Value: CULTURE WILL BE EXAMINED FOR 6 WEEKS BEFORE ISSUING A FINAL REPORT     Performed at [facility]   Report Status PENDING   Incomplete  BODY FLUID CULTURE     Status: None   Collection Time    01/11/14  2:53 PM      Result Value Ref Range Status   Specimen Description FLUID RIGHT PLEURAL   Final   Special Requests Normal   Final   Gram Stain     Final   Value: RARE WBC PRESENT,BOTH PMN AND MONONUCLEAR     NO ORGANISMS SEEN     Performed at [facility]     Final   Value: NO GROWTH     Performed at [facility]   Report Status PENDING   Incomplete     Studies:  Recent x-ray studies have been reviewed in detail by the Attending Physician  Scheduled Meds:  Scheduled Meds: . diltiazem  240 mg Oral Daily  . docusate sodium  100 mg Oral BID  . gabapentin  800 mg Oral TID  . gemfibrozil  600 mg Oral BID AC  . insulin aspart  0-20 Units Subcutaneous TID WC  . insulin aspart  0-5 Units Subcutaneous QHS  . insulin glargine  6 Units Subcutaneous QHS  . lisinopril  20 mg Oral Daily  . pantoprazole (PROTONIX) IV  40 mg Intravenous QHS  . piperacillin-tazobactam (ZOSYN)  IV  3.375 g Intravenous 3 times per day  . polyethylene glycol  17 g Oral Daily  . pregabalin  75 mg Oral BID  . senna-docusate  2 tablet Oral QHS  . sodium chloride  3 mL Intravenous Q12H   Continuous Infusions:   Time spent on care of this patient: >35 min   Debbe Odea, MD  [facility] Office  [phone] Pager - Text Page per Shea Evans as per below:  On-Call/Text Page:      [email or web address]  If 7PM-7AM, please contact  night-coverage [email or web address] 01/12/2014, 4:13 PM   LOS: 4 days

## 2014-01-12 NOTE — Progress Notes (Signed)
md at bedside and informed about pt's b/p. md stated to give one time dose of lisinopril po.

## 2014-01-12 NOTE — Progress Notes (Signed)
ANTIBIOTIC CONSULT NOTE - FOLLOW UP  Pharmacy Consult for Zosyn Indication: suspected postobstructive pneumonia  Allergies  Allergen Reactions  . Penicillins Rash    Patient Measurements: Height: 5' 10.87" (180 cm) Weight: 190 lb 0.6 oz (86.2 kg) IBW/kg (Calculated) : 74.99  Vital Signs: Temp: 97.8 F (36.6 C) (03/10 0735) Temp src: Oral (03/10 0735) BP: 135/58 mmHg (03/10 0735) Intake/Output from previous day: 03/09 0701 - 03/10 0700 In: 700 [P.O.:600; IV Piggyback:100] Out: 2390 [Urine:2390] Intake/Output from this shift:    Labs:  Recent Labs  01/10/14 0343 01/10/14 1923 01/11/14 0350 01/12/14 0616  WBC  --  19.2* 19.9* 18.0*  HGB  --  10.9* 10.9* 11.1*  PLT  --  111* 104* 103*  CREATININE 1.43*  --  1.29 1.26   Estimated Creatinine Clearance: 58.7 ml/min (by C-G formula based on Cr of 1.26).  Assessment: 67yom continues on day #4 Zosyn for suspected postobstructive pneumonia s/p thoracentesis 3/9. Renal function is stable.  Zosyn 3/7>>  3/7 Blood cx>>ngtd 3/9 R pleural fluid cx>>  Goal of Therapy:  Appropriate zosyn dosing  Plan:  1) Continue zosyn 3.375g IV q8 (4 hour infusion) 2) Continue to follow renal function, LOT, clinical status  Deboraha Sprang 01/12/2014,8:38 AM

## 2014-01-12 NOTE — Progress Notes (Signed)
Report called to receiving RN [id number]. Will transfer to [facility] via carelink. Patient with no complaints at current time.

## 2014-01-12 NOTE — Progress Notes (Signed)
[facility] Progress Note Patient Name: Andrew Church DOB: [birth year] MRN: [id number]  Date of Service  01/12/2014   HPI/Events of Note    Called by RN, family concerned about resp status. Working recent dx of airway involvement obstruction melanoma left  D/w Dr Hinda Lenis, he called and spoke to rad onc and they suggested to move to [facility] for consideration rad onc with awaited med onc consult called Monday.appears that pt and family had DNI order in place, pall care was also called.upon moving to [facility], pt with worsening resp status. Morphine utilized and increased, BIPAP ordered, O2 increased. Also spoke to Dr Earlie Server who let me know Dr Noreene Filbert would be at bedside shortly.  I spoke to daughters ( wife not there yet) they voiced frustration of no med oncology input for prognostication in order to help further finalize intubation wishes. Unlikely seems that ett, mech ventilation would be reasonable with likely poor response and circumstances.  Oncology at bedsisde now to discuss further with family. As above BIPAP ordered to temporize resp status for these important discussions.  eICU Interventions     Intervention Category Major Interventions: End of life / care limitation discussion  Raylene Miyamoto. 01/12/2014, 5:55 PM

## 2014-01-12 NOTE — Progress Notes (Signed)
[place]  [phone] [phone]) [phone]     ID: Andrew Church OB: [birth year]  Andrew#: [id number]  CSN#:[id number]  PCP: Simona Huh, MD GYN:   SU:  OTHER MD: Merrie Roof   INTERVAL HISTORY: According to the chart notes, the patient had what appeared to be an URI for some time. He presented to the ED 01/05/2014 with wporsening SOB and was found to be in AFIB with RVR. CXR showed a lung mass and CT angio showed no PE but bilateral effusions, left lower lobe and right upper lobe masses. He underwent bronchoscopy and mediastnoscopy 01/06/2014 with the pathology ([id number]). We were consulted to clarify the prognosis and treatment options for the family in light of the patient's rapid decline.  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 Family tells me the patient has had progressive breathing problems for several weeks, rapidly worsening last few days.  PAST MEDICAL HISTORY: Past Medical History  Diagnosis Date  . Neuropathy   . Prediabetes   . HTN (hypertension)   . Hyperlipemia   . Melanoma     a. 04/2003;  b. 01/2014 metastatic to the lung s/p bronch.  . Hx of cardiovascular stress test     a.  Lexiscan Myoview (11/2013): EF 62%, no ischemia; normal study.  . Fatty liver   . Diabetes mellitus     10/2004  . ED (erectile dysfunction)   . Hepatitis B carrier     per [facility] 2013  . CKD (chronic kidney disease), stage III   . PAF (paroxysmal atrial fibrillation)     a. dx 01/2014 in setting of dyspnea and hemoptysis - converted in ER on IV dilt;  b. CHA2DS2VASc = 3 (not anticoagulated 2/2 hemoptysis).    PAST SURGICAL HISTORY: Past Surgical History  Procedure Laterality Date  . Foot surgery    . Right ankle fracture      12/2005  . Lasik    . Hydrocele excision    . Melanoma excision    . Video bronchoscopy with  endobronchial ultrasound N/A 01/06/2014    Procedure: VIDEO BRONCHOSCOPY WITH ENDOBRONCHIAL ULTRASOUND;  Surgeon: Grace Isaac, MD;  Location[facility] OR;  Service: Thoracic;  Laterality: N/A;  . Bronchial needle aspiration biopsy N/A 01/06/2014    Procedure: NEEDLE BIOPSY;  Surgeon: Grace Isaac, MD;  Location: [place];  Service: Thoracic;  Laterality: N/A;  . Mediastinoscopy N/A 01/06/2014    Procedure: MEDIASTINOSCOPY;  Surgeon: Grace Isaac, MD;  Location: [place];  Service: Thoracic;  Laterality: N/A;    FAMILY HISTORY Family History  Problem Relation Age of Onset  . Heart attack Mother   . Emphysema Father       HEALTH MAINTENANCE: History  Substance Use Topics  . Smoking status: Never Smoker   . Smokeless tobacco: Never Used  . Alcohol Use: No    Allergies  Allergen Reactions  . Penicillins Rash    Tolerates Zosyn    Current Facility-Administered Medications  Medication Dose Route Frequency Provider Last Rate Last Dose  . 0.9 %  sodium chloride infusion  250 mL Intravenous PRN Belkys A Harrel Carina, MD      . calcium carbonate (TUMS - dosed in mg elemental calcium) chewable tablet 200 mg of elemental calcium  1 tablet Oral TID PRN Dianne Dun, NP   200 mg of elemental  calcium at 01/10/14 2118  . diltiazem (CARDIZEM CD) 24 hr capsule 240 mg  240 mg Oral Daily Annita Brod, MD   240 mg at 01/12/14 0917  . docusate sodium (COLACE) capsule 100 mg  100 mg Oral BID Belkys A Harrel Carina, MD   100 mg at 01/12/14 0917  . gabapentin (NEURONTIN) capsule 800 mg  800 mg Oral TID Belkys A Harrel Carina, MD   800 mg at 01/12/14 0917  . gemfibrozil (LOPID) tablet 600 mg  600 mg Oral BID AC Belkys A Harrel Carina, MD   600 mg at 01/12/14 0867  . hydrALAZINE (APRESOLINE) injection 10 mg  10 mg Intravenous Q6H PRN Tarri Fuller, PA-C   10 mg at 01/12/14 6195  . insulin aspart (novoLOG) injection 0-20 Units  0-20 Units Subcutaneous TID WC Annita Brod, MD   3  Units at 01/12/14 [phone]  . insulin aspart (novoLOG) injection 0-5 Units  0-5 Units Subcutaneous QHS Annita Brod, MD   3 Units at 01/10/14 2135  . insulin glargine (LANTUS) injection 6 Units  6 Units Subcutaneous QHS Annita Brod, MD   6 Units at 01/11/14 2240  . levalbuterol (XOPENEX) nebulizer solution 1.25 mg  1.25 mg Nebulization Q6H PRN Doree Fudge, MD      . lisinopril (PRINIVIL,ZESTRIL) tablet 20 mg  20 mg Oral Daily Sueanne Margarita, MD   20 mg at 01/12/14 0917  . LORazepam (ATIVAN) tablet 2 mg  2 mg Oral Q4H PRN Doree Fudge, MD   2 mg at 01/12/14 0919  . morphine 2 MG/ML injection 2 mg  2 mg Intravenous Q1H PRN Raylene Miyamoto, MD   2 mg at 01/12/14 1720  . ondansetron (ZOFRAN) tablet 4 mg  4 mg Oral Q6H PRN Belkys A Harrel Carina, MD       Or  . ondansetron (ZOFRAN) injection 4 mg  4 mg Intravenous Q6H PRN Belkys A Harrel Carina, MD      . oxyCODONE (Oxy IR/ROXICODONE) immediate release tablet 5-10 mg  5-10 mg Oral Q3H PRN Debbe Odea, MD   5 mg at 01/12/14 1349  . pantoprazole (PROTONIX) injection 40 mg  40 mg Intravenous QHS Erick Colace, NP   40 mg at 01/12/14 0216  . piperacillin-tazobactam (ZOSYN) IVPB 3.375 g  3.375 g Intravenous 3 times per day Harolyn Rutherford, RPH   3.375 g at 01/12/14 1340  . polyethylene glycol (MIRALAX / GLYCOLAX) packet 17 g  17 g Oral Daily Belkys A Harrel Carina, MD   17 g at 01/11/14 1022  . pregabalin (LYRICA) capsule 75 mg  75 mg Oral BID Belkys A Harrel Carina, MD   75 mg at 01/12/14 0917  . senna-docusate (Senokot-S) tablet 2 tablet  2 tablet Oral QHS Brand Males, MD   2 tablet at 01/10/14 2119  . sodium chloride (OCEAN) 0.65 % nasal spray 1 spray  1 spray Each Nare PRN Annita Brod, MD   1 spray at 01/10/14 [phone]  . sodium chloride 0.9 % injection 3 mL  3 mL Intravenous Q12H Belkys A Harrel Carina, MD   3 mL at 01/11/14 2230  . sodium chloride 0.9 % injection 3 mL  3 mL Intravenous PRN Belkys  A Harrel Carina, MD        OBJECTIVE: middle aged White male examined in bed Filed Vitals:   01/12/14 1700  BP: 203/85  Pulse: 101  Temp:   Resp: 19  Body mass index is 26.6 kg/(m^2).    ECOG FS:4 - Bedbound  Rales bilaterally on exam  LAB RESULTS:  CMP     Component Value Date/Time   NA 139 01/12/2014 0616   K 4.5 01/12/2014 0616   CL 98 01/12/2014 0616   CO2 23 01/12/2014 0616   GLUCOSE 137* 01/12/2014 0616   BUN 50* 01/12/2014 0616   CREATININE 1.26 01/12/2014 0616   CALCIUM 9.7 01/12/2014 0616   PROT 6.4 01/11/2014 1610   ALBUMIN 3.1* 01/11/2014 0350   AST 39* 01/11/2014 0350   ALT 23 01/11/2014 0350   ALKPHOS 90 01/11/2014 0350   BILITOT 0.3 01/11/2014 0350   GFRNONAA 57* 01/12/2014 0616   GFRAA 65* 01/12/2014 0616    I No results found for this basename: SPEP, UPEP,  kappa and lambda light chains    Lab Results  Component Value Date   WBC 18.0* 01/12/2014   NEUTROABS 3.1 06/05/2012   HGB 11.1* 01/12/2014   HCT 31.7* 01/12/2014   MCV 87.3 01/12/2014   PLT 103* 01/12/2014    @LASTCHEMISTRY @  No results found for this basename: LABCA2    No components found with this basename: LABCA125     Recent Labs Lab 01/06/14 0718  INR 1.09    Urinalysis    Component Value Date/Time   COLORURINE YELLOW 01/09/2014 1557   APPEARANCEUR CLEAR 01/09/2014 1557   LABSPEC 1.017 01/09/2014 1557   PHURINE 5.0 01/09/2014 1557   GLUCOSEU NEGATIVE 01/09/2014 [place] 01/09/2014 [place] 01/09/2014 [place] 01/09/2014 1557   PROTEINUR NEGATIVE 01/09/2014 1557   UROBILINOGEN 0.2 01/09/2014 1557   NITRITE NEGATIVE 01/09/2014 1557   LEUKOCYTESUR NEGATIVE 01/09/2014 1557    STUDIES: Ct Abdomen Pelvis Wo Contrast  01/19/2014   CLINICAL DATA:  Constipation. Hepatitis B. 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  EXAM: CT ABDOMEN AND PELVIS WITHOUT CONTRAST  TECHNIQUE: Multidetector CT imaging of the abdomen and pelvis was performed following the standard protocol  without intravenous contrast.  COMPARISON:  DG ABD ACUTE W/CHEST dated 01/20/2014  FINDINGS: Unchanged chest findings of mediastinal adenopathy, bilateral pleural effusions, and left lower lobe mass. Hepatic steatosis. Normal spleen, gallbladder, pancreas, and kidneys.  8 x 16 mm left adrenal nodularity, nonspecific, but changed from 2007, not visibly hypervascular, metastasis versus small adenoma (favored) are considerations.  Normal kidneys ureters, and bladder. No retroperitoneal adenopathy. No bowel obstruction or bowel wall thickening. Moderate gastric distention of a gaseous nature, but no visible outlet obstruction. Moderate stool burden. No osseous findings. Chronic bone island in T12. Superficial soft tissues unremarkable. No pathologically enlarged inguinal adenopathy or hernia. No appendiceal inflammation. Mild prostate enlargement.  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  8 x 16 mm left adrenal nodularity, nonspecific, but interval change since 2007.  No acute intra-abdominal findings.   Electronically Signed   By: Rolla Flatten M.D.   On: 01/27/2014 20:23   Chest 2 View  01/06/2014   CLINICAL DATA:  Lung mass.  Preop.  EXAM: CHEST  2 VIEW  COMPARISON:  12/29/2013  FINDINGS: Unchanged appearance of left infrahilar mass with bilateral mediastinal lymphadenopathy. There are small bilateral pleural effusions, also seen previously. No edema or consolidation. Normal heart size. No acute osseous findings.  IMPRESSION: 1. Likely malignant left lung mass and lymphadenopathy. 2. Trace bilateral pleural effusions, unchanged from 12/29/2013.   Electronically Signed   By: Gilford Silvius.D.  On: 01/06/2014 07:16   Dg Chest 2 View  12/29/2013   CLINICAL DATA:  Cough, congestion, shortness of breath for 2 weeks, evaluate for pneumonia  EXAM: CHEST  2 VIEW  COMPARISON:  DG CHEST 1V PORT dated 09/03/2013; DG CHEST 2 VIEW dated 06/05/2012; DG CHEST 2 VIEW  dated 06/07/2011  FINDINGS: Grossly unchanged borderline enlarged cardiac silhouette. Interval development of an approximately 4.9 x 4.2 cm apparent mass within the superior segment of the left lower lobe, best appreciated on the provided lateral radiograph. This finding is associated with apparent nodular thickening of the contralateral right paratracheal stripe. The lungs remain hyperexpanded with flattening of bilateral hemidiaphragms and mild diffuse slightly nodular thickening of the pulmonary interstitium. Interval development of trace bilateral pleural effusions. Worsening left basilar heterogeneous/consolidative opacities. No evidence of edema. No pneumothorax. Grossly unchanged bones.  IMPRESSION: 1. Interval development of an approximately 4.9 cm mass within the superior segment left lower lobe with possible adenopathy within in the contralateral right mediastinum. Further evaluation with contrast-enhanced chest CT is recommended. 2. Worsening left basilar/retrocardiac opacities, atelectasis versus infiltrate. 3. Interval development of trace bilateral pleural effusions. No evidence of edema. These results will be called to the ordering clinician or representative by the Radiologist Assistant, and communication documented in the PACS Dashboard.   Electronically Signed   By: Sandi Mariscal M.D.   On: 12/29/2013 16:56   Ct Chest W Contrast  12/31/2013   CLINICAL DATA:  Abnormal chest radiograph with possible adenopathy, chest pain, shortness of breath.  EXAM: CT CHEST WITH CONTRAST  TECHNIQUE: Multidetector CT imaging of the chest was performed during intravenous contrast administration.  CONTRAST:  94m OMNIPAQUE IOHEXOL 300 MG/ML  SOLN  COMPARISON:  Chest radiographs dated 12/29/2013.  FINDINGS: 5.0 x 4.4 cm central left lower lobe mass (series 3/image 39), suspicious for primary bronchogenic neoplasm. Mass extends into the left hilar region (series 2/ image 34). Narrowing of the left lower lobe bronchus.   Small right and trace left pleural effusions.  No pneumothorax.  Visualized thyroid is unremarkable.  The heart is normal in size. No pericardial effusion. Mild coronary atherosclerosis in the LAD. No evidence of thoracic aortic aneurysm.  Associated mediastinal lymphadenopathy, including:  --8 mm short axis right supraclavicular node (series 2/image 9)  --20 mm short axis high right paratracheal node (series 2/image 21)  --13 mm short axis right hilar node (series 2/image 32)  --31 mm short axis subcarinal node (series 2/image 34)  Visualized upper abdomen is unremarkable. Bilateral adrenal glands are within normal limits.  Degenerative changes of the visualized thoracolumbar spine. Probable bone island at T12 (sagittal image 79).  IMPRESSION: 5.0 cm central left lower lobe mass, suspicious for primary bronchogenic neoplasm.  Associated thoracic lymphadenopathy, as described above, including a prominent 8 mm short axis right supraclavicular node.  Small right and trace left pleural effusions.  For tissue diagnosis, consider bronchoscopy or percutaneous sampling of the right supraclavicular node.  These results will be called to the ordering clinician or representative by the Radiologist Assistant, and communication documented in the PACS Dashboard.   Electronically Signed   By: SJulian HyM.D.   On: 12/31/2013 14:36   Ct Angio Chest Pe W/cm &/or Wo Cm  01/22/2014   CLINICAL DATA:  Left lower lobe mass. Status post bronchoscopy and biopsy 2 days ago. Worsening shortness of breath. Hemoptysis.  EXAM: CT ANGIOGRAPHY CHEST WITH CONTRAST  TECHNIQUE: Multidetector CT imaging of the chest was performed using the standard protocol during  bolus administration of intravenous contrast. Multiplanar CT image reconstructions and MIPs were obtained to evaluate the vascular anatomy.  CONTRAST:  70 mL OMNIPAQUE IOHEXOL 350 MG/ML SOLN  COMPARISON:  CT chest 12/31/2013. Chest in two views abdomen earlier this same day.   FINDINGS: There is a moderate right and small left pleural effusion. No pericardial effusion is identified. No pulmonary embolus is identified. The descending left interlobar and ascending right interlobar pulmonary arteries are narrowed by the patient's left lower lobe mass and mediastinal adenopathy.  Bulky mediastinal lymphadenopathy is unchanged. Again seen is a 2.0 cm right paratracheal node on image 28, a 3.1 cm subcarinal node on image 47 and a 2.7 cm left subcarinal lymph node on image 52. 5 cm in diameter left lower lobe mass is again identified. Increased compressive atelectasis is seen in the bases. There is a new nodular opacity in the right upper lobe measuring 1.1 x 0.8 cm on image 27. Incidentally imaged upper abdomen is unremarkable. No focal bony abnormality is identified.  Review of the MIP images confirms the above findings.  IMPRESSION: Negative for pulmonary embolus. Both the descending left interlobar and ascending right interlobar pulmonary arteries are compressed by lymphadenopathy and the left lower lobe mass.  No change in bulky mediastinal lymphadenopathy and a left lower lobe pulmonary mass.  Some increase in a moderate right and small left pleural effusion.  1.1 cm nodular opacity right upper lobe is new since CT scan 2 weeks ago and indeterminate. Recommend attention on follow-up exams.   Electronically Signed   By: Inge Rise M.D.   On: 01/10/2014 14:33   Andrew Jeri Cos IO Contrast  01/10/2014   CLINICAL DATA:  Metastatic melanoma.  EXAM: MRI HEAD WITHOUT AND WITH CONTRAST  TECHNIQUE: Multiplanar, multiecho pulse sequences of the brain and surrounding structures were obtained without and with intravenous contrast.  CONTRAST:  61m MULTIHANCE GADOBENATE DIMEGLUMINE 529 MG/ML IV SOLN  COMPARISON:  Head CT 05/12/2005  FINDINGS: There is no acute infarct. There is no evidence of intracranial hemorrhage, mass, midline shift, or extra-axial fluid collection. There is no abnormal  enhancement. There is mild generalized cerebral atrophy. Small, scattered foci of T2 hyperintensity are present in the deep cerebral white matter bilaterally, nonspecific but compatible with mild chronic small vessel ischemic disease.  Orbits are unremarkable. Paranasal sinuses and mastoid air cells are clear. Major intracranial vascular flow voids are preserved. There is heterogeneous marrow signal at C2 involving the dens.  IMPRESSION: 1. No evidence of intracranial metastatic disease or acute intracranial abnormality. 2. Heterogeneous marrow signal in the dens, possibly degenerative although metastatic disease cannot be completely excluded. 3. Mild chronic small vessel ischemic disease.   Electronically Signed   By: ALogan Bores  On: 01/10/2014 11:57   Dg Chest Port 1 View  01/12/2014   CLINICAL DATA:  Respiratory distress.  EXAM: PORTABLE CHEST - 1 VIEW  COMPARISON:  DG CHEST 1V PORT dated 01/11/2014; CT ANGIO CHEST W/CM &/OR WO/CM dated 02/01/2014  FINDINGS: Cardiac silhouette appears mildly enlarged, similar prominent mediastinal silhouette. Slightly decreased central pulmonary vasculature congestion with persistent left lung base airspace opacity and small left pleural effusion. No pneumothorax.  Multiple EKG lines overlie the patient and may obscure subtle underlying pathology. Mild degenerative change of the thoracic spine. Gaseous distended stomach.  IMPRESSION: Mild cardiomegaly, slightly decreased central pulmonary vasculature congestion with persistent left lung base airspace opacity and small pleural effusion.  Mildly prominent mediastinal silhouette corresponding to known lymphadenopathy.  Electronically Signed   By: Elon Alas   On: 01/12/2014 01:28   Dg Chest Port 1 View  01/11/2014   CLINICAL DATA:  Post thoracentesis  EXAM: PORTABLE CHEST - 1 VIEW  COMPARISON:  Portable exam 2878 hr compared to 01/10/2014 ; correlation CT chest 01/06/2014  FINDINGS: Normal heart size and pulmonary  vascularity.  Prominent right peritracheal soft tissues and prominent left hilum corresponding to adenopathy on prior CT. Atelectasis versus consolidation left lower lobe.  Minimal right basilar atelectasis.  Persistent left pleural effusion.  No pneumothorax.  IMPRESSION: No pneumothorax following thoracentesis.  Persistent left pleural effusion and left lower lobe atelectasis versus consolidation.  Thoracic adenopathy.   Electronically Signed   By: Lavonia Dana M.D.   On: 01/11/2014 17:10   Dg Chest Port 1 View  01/10/2014   CLINICAL DATA:  Assess infiltrates.  EXAM: PORTABLE CHEST - 1 VIEW  COMPARISON:  DG CHEST 1V PORT dated 01/09/2014; CT ANGIO CHEST W/CM &/OR WO/CM dated 01/27/2014  FINDINGS: Stable enlargement of the mediastinum is consistent with lymphadenopathy. Increased densities in the left lung could represent edema or layering pleural fluid. Persistent densities in the medial right lung base. Stable consolidation in the retrocardiac space.  IMPRESSION: Increased densities in the left lung could represent edema or layering fluid.  Persistent consolidation in the retrocardiac space.  Mediastinal lymphadenopathy.   Electronically Signed   By: Markus Daft M.D.   On: 01/10/2014 08:27   Dg Chest Port 1 View  01/09/2014   CLINICAL DATA:  Shortness of breath.  EXAM: PORTABLE CHEST - 1 VIEW  COMPARISON:  01/14/2014  FINDINGS: Chest radiograph demonstrates persistent densities in the medial left lower lung. Findings are compatible with volume loss and consolidation. Heart size is grossly stable. The trachea is midline. Again noted is widening of the mediastinum related to the known lymphadenopathy.  IMPRESSION: Persistent consolidation in the medial left lower lung.  Mediastinal lymphadenopathy.   Electronically Signed   By: Markus Daft M.D.   On: 01/09/2014 14:35   Dg Abd Acute W/chest  01/20/2014   CLINICAL DATA:  Shortness of breath.  Abdominal pain and distention.  EXAM: ACUTE ABDOMEN SERIES (ABDOMEN 2 VIEW &  CHEST 1 VIEW)  COMPARISON:  CT chest 12/31/2013 and PA and lateral chest 01/06/2014.  FINDINGS: Single view of the chest demonstrates bulky mediastinal lymphadenopathy. Left lower lobe mass is seen as on the prior study. There are small bilateral pleural effusions. No pneumothorax.  Two views of the abdomen show no free intraperitoneal air. The stomach appears distended. There is no evidence of small bowel obstruction. Large volume of stool throughout the colon is noted.  IMPRESSION: Left lower lobe mass and mediastinal lymphadenopathy as seen on prior CT. Small bilateral pleural effusions are identified.  Negative for free intraperitoneal air or bowel obstruction.  Gaseous distention of the stomach.  Large volume of stool throughout the colon.   Electronically Signed   By: Inge Rise M.D.   On: 01/11/2014 13:51    ASSESSMENT: 68 y.o. [place] man presenting with progressive respiratory insifficiency, s/p video bronchoscopy and mediatinoscopy with biopsy 01/06/2014 showing metastatic melanoma, clinically involving bilateral effusions, left lower lobe mass, right upper lobe mass and mediastinal adnopathy 1. Stage II, superficial spreading malignant melanoma involving the right groin with the diagnosis going back to June 2004..  2. Neuropathy  3. Prediabetes  4. Hyperlipidemia  5. Hypertension  PLAN: I have met with the patient's wife and the two daughters present  for approximately an hour. They understand Andrew Church's cancer is not curable and it is likely to take his life in a short time. Intubation and mechanical ventlation might prolong the dying process but not the patient's conscious life. Radiation is likley to be futile in the face of bilateral effusions and bilateral lung masses. It might even shorten the patient's life some. Accordingly my recommendation is that we proceed to palliation, supporting the patient's respiration as best we can and in any case making sure he is  comfortable.  The wife (who has HCPOA) and daughtersdo not wish to prolong Andrew Church's dying. I have discussed this with the patient with his family present and he undestands the plan. We will do all e can to support his breathing but will not do CPR or intubation of other resuscitative measures when the end comes. qwe will make sure he is comfortable.  I have changes some orders (discussed with Dr Titus Mould). Family wished to possibly clarify somelegal issues and I have documented above that as of 18:27 PM Andrew Dagher knew the year, the place , and that his wife's name was "Isa Rankin [place]."   Chauncey Cruel, MD   01/12/2014 5:42 PM

## 2014-01-12 NOTE — Progress Notes (Signed)
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   SIGNIFICANT EVENTS / STUDIES:  3/6  Abdomen CT >>> nad, left adrenal nodularity, unchanged since 2007 3/6  Chest CTA >>> LLL mass, lymphadenopathy, bilateral effusions 3/8  TTE >>> Ef 65%, no RWMA 3/8  Brain MRI >>> No overt CNS Mx   LINES / TUBES:  CULTURES: 3/7  Blood >>>  ANTIBIOTICS: Zosyn 3/7>>>  INTERVAL HISTORY:   Anxious.  VITAL SIGNS: Temp:  [97.8 F (36.6 C)-98.2 F (36.8 C)] 97.8 F (36.6 C) (03/10 0735) Pulse Rate:  [108] 108 (03/09 2007) Resp:  [20-23] 22 (03/10 0622) BP: (135-199)/(58-95) 135/58 mmHg (03/10 0735) SpO2:  [93 %-100 %] 96 % (03/10 0923)  HEMODYNAMICS:   VENTILATOR:   INPUT / OUTPUT: Intake/Output     03/09 0701 - 03/10 0700 03/10 0701 - 03/11 0700   P.O. 600    IV Piggyback 100    Total Intake(mL/kg) 700 (8.1)    Urine (mL/kg/hr) 2390 (1.2)    Total Output 2390     Net -1690          Stool Occurrence 2 x     PHYSICAL EXAMINATION: General:  No distress, anxious Neuro:  Awake, alert, cooperative  HEENT:  PERRL Cardiovascular:  RRR, no m/r/g Lungs:  Bilateral diminished air entry, no added sounds Abdomen:  Soft, nontender, bowel sounds diminished Musculoskeletal:  Moves all extremities, no edema Skin:  Intact  LABS: CBC  Recent Labs Lab 01/10/14 1923 01/11/14 0350 01/12/14 0616  WBC 19.2* 19.9* 18.0*  HGB 10.9* 10.9* 11.1*  HCT 30.7* 31.1* 31.7*  PLT 111* 104* 103*   Coag's  Recent Labs Lab 01/06/14 0718  APTT 34  INR 1.09   BMET  Recent Labs Lab  01/10/14 0343 01/11/14 0350 01/12/14 0616  NA 136* 139 139  K 4.1 4.3 4.5  CL 94* 98 98  CO2 17* 20 23  BUN 39* 48* 50*  CREATININE 1.43* 1.29 1.26  GLUCOSE 213* 178* 137*   Electrolytes  Recent Labs Lab 01/16/2014 1150  01/10/14 0343 01/11/14 0350 01/12/14 0616  CALCIUM 9.8  < > 10.0 9.9 9.7  MG 2.1  --   --   --   --   < > = values in this interval not displayed. Sepsis Markers  Recent Labs Lab 01/09/14 1200 01/09/14 2051 01/10/14 0343 01/11/14 0350 01/11/14 1500  LATICACIDVEN  --  6.6*  --  0.2* 6.2*  PROCALCITON 0.13  --  <0.10 0.15  --    ABG  Recent Labs Lab 01/03/2014 1638  PHART 7.432  PCO2ART 27.2*  PO2ART 57.0*   Liver Enzymes  Recent Labs Lab 01/06/14 0718 01/10/14 0343 01/11/14 0350  AST 50* 46* 39*  ALT 31 24 23   ALKPHOS 128* 98 90  BILITOT 0.4 0.4 0.3  ALBUMIN 3.7 3.2* 3.1*   Cardiac Enzymes  Recent Labs Lab 01/28/2014 1546 01/07/2014 1614 01/27/2014 2236 01/09/14 0247 01/09/14 0248  TROPONINI  --  <0.30 <0.30 <0.30  --   PROBNP 305.4*  --   --   --  1296.0*   Glucose  Recent Labs Lab 01/10/14 2121 01/11/14 0741 01/11/14 1144 01/11/14 1611 01/11/14 2230 01/12/14 0731  GLUCAP 253* 176* 239* 166* 169* 147*   IMAGING:  Mr Kizzie Fantasia Contrast  01/10/2014   CLINICAL DATA:  Metastatic melanoma.  EXAM: MRI HEAD WITHOUT AND WITH CONTRAST  TECHNIQUE: Multiplanar, multiecho pulse sequences of the brain and surrounding structures were obtained without and with intravenous contrast.  CONTRAST:  85mL MULTIHANCE GADOBENATE DIMEGLUMINE 529 MG/ML IV SOLN  COMPARISON:  Head CT 05/12/2005  FINDINGS: There is no acute infarct. There is no evidence of intracranial hemorrhage, mass, midline shift, or extra-axial fluid collection. There is no abnormal enhancement. There is mild generalized cerebral atrophy. Small, scattered foci of T2 hyperintensity are present in the deep cerebral white matter bilaterally, nonspecific but compatible with mild chronic  small vessel ischemic disease.  Orbits are unremarkable. Paranasal sinuses and mastoid air cells are clear. Major intracranial vascular flow voids are preserved. There is heterogeneous marrow signal at C2 involving the dens.  IMPRESSION: 1. No evidence of intracranial metastatic disease or acute intracranial abnormality. 2. Heterogeneous marrow signal in the dens, possibly degenerative although metastatic disease cannot be completely excluded. 3. Mild chronic small vessel ischemic disease.   Electronically Signed   By: Logan Bores   On: 01/10/2014 11:57   Dg Chest Port 1 View  01/12/2014   CLINICAL DATA:  Respiratory distress.  EXAM: PORTABLE CHEST - 1 VIEW  COMPARISON:  DG CHEST 1V PORT dated 01/11/2014; CT ANGIO CHEST W/CM &/OR WO/CM dated 01/06/2014  FINDINGS: Cardiac silhouette appears mildly enlarged, similar prominent mediastinal silhouette. Slightly decreased central pulmonary vasculature congestion with persistent left lung base airspace opacity and small left pleural effusion. No pneumothorax.  Multiple EKG lines overlie the patient and may obscure subtle underlying pathology. Mild degenerative change of the thoracic spine. Gaseous distended stomach.  IMPRESSION: Mild cardiomegaly, slightly decreased central pulmonary vasculature congestion with persistent left lung base airspace opacity and small pleural effusion.  Mildly prominent mediastinal silhouette corresponding to known lymphadenopathy.   Electronically Signed   By: Elon Alas   On: 01/12/2014 01:28   Dg Chest Port 1 View  01/11/2014   CLINICAL DATA:  Post thoracentesis  EXAM: PORTABLE CHEST - 1 VIEW  COMPARISON:  Portable exam [id number] hr compared to 01/10/2014 ; correlation CT chest 01/27/2014  FINDINGS: Normal heart size and pulmonary vascularity.  Prominent right peritracheal soft tissues and prominent left hilum corresponding to adenopathy on prior CT. Atelectasis versus consolidation left lower lobe.  Minimal right basilar atelectasis.   Persistent left pleural effusion.  No pneumothorax.  IMPRESSION: No pneumothorax following thoracentesis.  Persistent left pleural effusion and left lower lobe atelectasis versus consolidation.  Thoracic adenopathy.   Electronically Signed   By: Lavonia Dana M.D.   On: 01/11/2014 17:10   ASSESSMENT / PLAN:  PULMONARY A: Acute respiratory failure Post procedureal hemoptysis, resolved Suspected post obstructive pneumonia Bilateral effusions s/p R thoracentesis, without significant improvement of symptoms Significant bilateral airway compression by tumor / adenopathy.  This was reviewed with radiologist. This is most likely the main reason for patient's symptoms of wheezing and dyspnea.  This is NOT amendable to stenting due to high tumor burden and multiple locations. P:   Goal SpO2>92 Supplemental oxygen Change BD to Xopenex PRN only Will attempt to contact Medical Oncology to determine if any acute treatment is available Per family discussion - DNI  CARDIOVASCULAR A:  AF / RVR, now sinus tachycardia P: Cardiology following Goal MAP>65,  HR<110 Lopid, Cardizem, Lisinopril Hydralazine PRN Recheck lactate  DNR  RENAL A: AKI Metabolic acidosis, etiology? P: Trend BMP  GI A: Nutrition GI Px is not indicated P: Diet  HEM: A: Metastatic melanoma  - new dx P:   Oncology and Palliative Care discussion  ID A: Suspected postobstructive pneumonia Leukocytosis is likely partly steroid induced P: Abx as above  ENDOCRINE A: DM Hyperglycemia P: SSI Lantus 6  NEURO A: Anxiety Pain P: Ativan PRN Oxycodone PRN Morphine PRN Neurontin Lyrica Will get Palliative Care involved  I have personally obtained history, examined patient, evaluated and interpreted laboratory and imaging results, reviewed medical records, formulated assessment / plan and placed orders.  Doree Fudge, MD Pulmonary and [place] Pager: [phone]  01/12/2014, 10:11 AM

## 2014-01-12 NOTE — Progress Notes (Signed)
md stated to hold medications for now. To prioritize care around pt comfort. Will continue to monitor.

## 2014-01-13 DIAGNOSIS — K5900 Constipation, unspecified: Secondary | ICD-10-CM

## 2014-01-13 LAB — ADENOSINE DEAMINASE, FLUID: ADENOSINE DEAMINASE FL: 7.3 U/L (ref <7.6)

## 2014-01-13 MED ORDER — ATROPINE SULFATE 1 % OP SOLN
2.0000 [drp] | Freq: Four times a day (QID) | OPHTHALMIC | Status: DC | PRN
  Administered 2014-01-13 – 2014-01-15 (×3): 2 [drp] via SUBLINGUAL
  Filled 2014-01-13: qty 2

## 2014-01-13 MED ORDER — VITAMINS A & D EX OINT
TOPICAL_OINTMENT | CUTANEOUS | Status: AC
  Filled 2014-01-13: qty 5

## 2014-01-13 MED ORDER — LEVALBUTEROL HCL 1.25 MG/0.5ML IN NEBU
1.2500 mg | INHALATION_SOLUTION | Freq: Four times a day (QID) | RESPIRATORY_TRACT | Status: DC
  Administered 2014-01-13 – 2014-01-15 (×7): 1.25 mg via RESPIRATORY_TRACT
  Filled 2014-01-13 (×10): qty 0.5

## 2014-01-13 MED ORDER — LORAZEPAM 2 MG/ML IJ SOLN
INTRAMUSCULAR | Status: AC
  Filled 2014-01-13: qty 1

## 2014-01-13 MED ORDER — SCOPOLAMINE 1 MG/3DAYS TD PT72
1.0000 | MEDICATED_PATCH | TRANSDERMAL | Status: DC
  Administered 2014-01-13: 1.5 mg via TRANSDERMAL
  Filled 2014-01-13: qty 1

## 2014-01-13 MED ORDER — HYDRALAZINE HCL 20 MG/ML IJ SOLN
10.0000 mg | Freq: Four times a day (QID) | INTRAMUSCULAR | Status: DC | PRN
  Filled 2014-01-13: qty 0.5

## 2014-01-13 MED ORDER — LORAZEPAM 2 MG/ML IJ SOLN
2.0000 mg | INTRAMUSCULAR | Status: DC | PRN
  Administered 2014-01-13 (×2): 2 mg via INTRAVENOUS
  Filled 2014-01-13: qty 1

## 2014-01-13 MED ORDER — LORAZEPAM 2 MG/ML IJ SOLN
1.0000 mg | INTRAMUSCULAR | Status: DC | PRN
  Administered 2014-01-13 – 2014-01-15 (×2): 2 mg via INTRAVENOUS
  Filled 2014-01-13 (×2): qty 1

## 2014-01-13 MED ORDER — MORPHINE SULFATE 4 MG/ML IJ SOLN: 4.0000 mg | INTRAMUSCULAR | Status: DC | PRN

## 2014-01-13 MED ORDER — LEVALBUTEROL HCL 1.25 MG/0.5ML IN NEBU
1.2500 mg | INHALATION_SOLUTION | Freq: Four times a day (QID) | RESPIRATORY_TRACT | Status: DC
  Administered 2014-01-13: 1.25 mg via RESPIRATORY_TRACT
  Filled 2014-01-13 (×4): qty 0.5

## 2014-01-13 MED ORDER — DIAZEPAM 5 MG/ML IJ SOLN: 2.5000 mg | Freq: Once | INTRAMUSCULAR | Status: DC

## 2014-01-13 NOTE — Progress Notes (Signed)
Subjective:  Audible wheezing  Objective:   Vital Signs in the last 24 hours: Temp:  [97.4 F (36.3 C)-98.4 F (36.9 C)] 97.4 F (36.3 C) (03/11 0815) Pulse Rate:  [88-108] 88 (03/11 0815) Resp:  [12-34] 13 (03/11 0815) BP: (132-203)/(74-118) 163/101 mmHg (03/11 0815) SpO2:  [93 %-97 %] 95 % (03/11 0815) FiO2 (%):  [50 %] 50 % (03/10 2152)  Intake/Output from previous day: 03/10 0701 - 03/11 0700 In: 778.8 [P.O.:600; I.V.:41.3; IV Piggyback:137.5] Out: 1800 [Urine:1800]  Medications: . diltiazem  120 mg Oral Daily  . furosemide  20 mg Intravenous BID  . insulin aspart  0-20 Units Subcutaneous TID WC  . insulin aspart  0-5 Units Subcutaneous QHS  . pantoprazole (PROTONIX) IV  40 mg Intravenous QHS  . piperacillin-tazobactam (ZOSYN)  IV  3.375 g Intravenous 3 times per day  . pregabalin  75 mg Oral BID  . sodium chloride  3 mL Intravenous Q12H    . morphine 2 mg/hr (01/12/14 2129)    Physical Exam:   General appearance: Not very alert Neck: no carotid bruit, no JVD, supple, symmetrical, trachea midline and thyroid not enlarged, symmetric, no tenderness/mass/nodules Lungs: Wheezing bilaterally Heart: regular rate and rhythm and 1/6 sem Abdomen: soft, non-tender; bowel sounds normal; no masses,  no organomegaly Extremities: no edema, redness or tenderness in the calves or thighs   Rate: 95  Rhythm: normal sinus rhythm  Lab Results:    Recent Labs  01/11/14 0350 01/12/14 0616  NA 139 139  K 4.3 4.5  CL 98 98  CO2 20 23  GLUCOSE 178* 137*  BUN 48* 50*  CREATININE 1.29 1.26   No results found for this basename: TROPONINI, CK, MB,  in the last 72 hours Hepatic Function Panel  Recent Labs  01/11/14 0350 01/11/14 1610  PROT 6.0 6.4  ALBUMIN 3.1*  --   AST 39*  --   ALT 23  --   ALKPHOS 90  --   BILITOT 0.3  --    No results found for this basename: INR,  in the last 72 hours BNP (last 3 results)  Recent Labs  02/01/2014 1546 01/09/14 0248    PROBNP 305.4* 1296.0*   CBC    Component Value Date/Time   WBC 18.0* 01/12/2014 0616   WBC 4.8 06/05/2012 1309   RBC 3.63* 01/12/2014 0616   RBC 4.33 06/05/2012 1309   HGB 11.1* 01/12/2014 0616   HGB 13.6 06/05/2012 1309   HCT 31.7* 01/12/2014 0616   HCT 39.9 06/05/2012 1309   PLT 103* 01/12/2014 0616   PLT 120* 06/05/2012 1309   MCV 87.3 01/12/2014 0616   MCV 92.1 06/05/2012 1309   MCH 30.6 01/12/2014 0616   MCH 31.4 06/05/2012 1309   MCHC 35.0 01/12/2014 0616   MCHC 34.1 06/05/2012 1309   RDW 13.1 01/12/2014 0616   RDW 14.0 06/05/2012 1309   LYMPHSABS 1.2 06/05/2012 1309   MONOABS 0.3 06/05/2012 1309   EOSABS 0.1 06/05/2012 1309   BASOSABS 0.0 06/05/2012 1309    Lipid Panel     Component Value Date/Time   CHOL 172 01/11/2014 1610      Imaging:  Dg Chest Port 1 View  01/12/2014   CLINICAL DATA:  Respiratory distress.  EXAM: PORTABLE CHEST - 1 VIEW  COMPARISON:  DG CHEST 1V PORT dated 01/11/2014; CT ANGIO CHEST W/CM &/OR WO/CM dated 01/04/2014  FINDINGS: Cardiac silhouette appears mildly enlarged, similar prominent mediastinal silhouette. Slightly decreased central pulmonary vasculature  congestion with persistent left lung base airspace opacity and small left pleural effusion. No pneumothorax.  Multiple EKG lines overlie the patient and may obscure subtle underlying pathology. Mild degenerative change of the thoracic spine. Gaseous distended stomach.  IMPRESSION: Mild cardiomegaly, slightly decreased central pulmonary vasculature congestion with persistent left lung base airspace opacity and small pleural effusion.  Mildly prominent mediastinal silhouette corresponding to known lymphadenopathy.   Electronically Signed   By: Elon Alas   On: 01/12/2014 01:28   Dg Chest Port 1 View  01/11/2014   CLINICAL DATA:  Post thoracentesis  EXAM: PORTABLE CHEST - 1 VIEW  COMPARISON:  Portable exam [id number] hr compared to 01/10/2014 ; correlation CT chest 01/19/2014  FINDINGS: Normal heart size and pulmonary vascularity.   Prominent right peritracheal soft tissues and prominent left hilum corresponding to adenopathy on prior CT. Atelectasis versus consolidation left lower lobe.  Minimal right basilar atelectasis.  Persistent left pleural effusion.  No pneumothorax.  IMPRESSION: No pneumothorax following thoracentesis.  Persistent left pleural effusion and left lower lobe atelectasis versus consolidation.  Thoracic adenopathy.   Electronically Signed   By: Lavonia Dana M.D.   On: 01/11/2014 17:10      Assessment/Plan:   Principal Problem:   Acute respiratory failure Active Problems:   Melanoma   Left Lower Lobe lung Mass   Shortness of breath   Metastatic melanoma to lung   Abdominal distension   Unspecified constipation   Syncope   Metabolic acidosis   Metabolic alkalosis   LHTDSK(876.81)   Atrial fibrillation with RVR   ARF (acute renal failure)   Hepatitis B  Pt now at [facility]. Notes reviewed. Apparently decision made last night by Dr. Jana Hakim and family for comfort care. Has not received po meds. BP elevated. If po meds are to resumed consider ARB rather than ACE-I for BP. On IV lasix. Not getting oral diltiazem presently; consider iv if decision is to treat. Will sign off.    Troy Sine, MD, [facility] 01/13/2014, 9:05 AM

## 2014-01-13 NOTE — Progress Notes (Signed)
Called by nurse Verl Dicker. RN re: concerns documented in prior note. Patient transferred from ICU around 4PM-I met with family this morning around 10AM-wife and one daughter present- other daughters were unavailable. At the time of my discussion with them-the goal of comfort had already been established upon review of the record and upon strong recommendation of oncology, Dr. Jana Hakim. I proceeded this AM to confirm goals and to assist with this delicate transition and to prepare family for next steps. I discussed what a transition to comfort should look like and that part of that was moving him to a more comfortable environment and less invasive-that continued monitoring would no longer be necessary but that a high level of comfort care would be provided- ideally on 3E Oncology since we did not have a palliative care unit. I also introduced them to the concept of Hospice and once we moved him that we could make a referral for the Hospice team to see him in the hospital- GIP/HIP. I think the family would greatly benefit from Hospice intervention and support. They report feeling "blindsided by his rapid decline" -they had no idea that he was "dying or even close to that".   I responded to an urgent call from RN which definitely needed an in person discussion- multiple new family members had arrived -including 2 additional daughters who were not a part of the conversation this AM. Family very upset-wife visibly exhausted and had a very different demeanor and attitude than earlier today-she is clearly overwhelmed and unhappy with the care her husband has received today. When I arrived at the bedside Mr. Lalley was visibly agitated, ripping off his gown, confused, having severe dyspnea-he had just had a Foley placed for comfort and had no urine output since this AM-with massive urinary retension-now relieved. He required several bolus doses of PRN Ativan and morphine to settle down. I assisted RN in getting him calmed  down and appropriately medicated for comfort.   Family with different expectations for "palliative" -daughters had done research on palliative and said they were disappointed and that they had not felt comforted today-his daughter said "my dad is dying and I just want some compassion and information on what to expect". Room was very chaotic. They said room was small and uncomfortable. Wife upset because patient could not order food tray that they always shared. Additionally one daughter wanted to see his O2 sat-because she had a medical background.   I provided comfort support and reassurance and they all seemed to be settled. Will see in AM.   Lane Hacker, DO Palliative Medicine

## 2014-01-13 NOTE — Progress Notes (Signed)
CARE MANAGEMENT NOTE 01/13/2014  Patient:  Andrew Church, Andrew Church   Account Number:  [id number]  Date Initiated:  01/11/2014  Documentation initiated by:  Andrew Church  Subjective/Objective Assessment:   adm w shortness of breath     Action/Plan:   lives w wife, pcp dr Andrew Church   Anticipated DC Date:  01/16/2014   Anticipated DC Plan:  ACUTE TO ACUTE TRANS  In-house referral  Hospice / [facility]  CM consult      Choice offered to / List presented to:             Status of service:  In process, will continue to follow Medicare Important Message given?   (If response is "NO", the following Medicare IM given date fields will be blank) Date Medicare IM given:   Date Additional Medicare IM given:    Discharge Disposition:    Per UR Regulation:  Reviewed for med. necessity/level of care/duration of stay  If discussed at [place] of Stay Meetings, dates discussed:    Comments:  [id number]/Andrew Church, Andrew Church, [place] [phone] Chart Reviewed for discharge and hospital needs. Discharge needs at time of review:  None present will follow for needs. Review of patient progress due on [id number]. Patient being transferred to medical floor for continued care/prognosis is very poor.  Per Dr. Hilma Favors do not offer choices at this time.

## 2014-01-13 NOTE — Progress Notes (Signed)
Pt with increased restlessness and agitation. MD called and informed. Stated she would change po ativan to IV>

## 2014-01-13 NOTE — Progress Notes (Signed)
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 Patient was transferred to [facility] for oncology input and radiation oncology input, he was seen by oncologist Dr.Magrinat and after detailed discussion with family, patient and palliative care it was decided that patient will be best served with full comfort care which was started on 01/12/2014. He should today appears to be unresponsive but comfortable, family by bedside, they agree with the plan a full comfort care, he is currently on IV morphine and IV Ativan. All other medications have been stopped except medications for comfort. Goal of care is full comfort care. Expect death soon.    [name], MD      Filed Vitals:   01/13/14 0815  BP: 163/101  Pulse: 88  Temp: 97.4 F (36.3 C)  Resp: 13    Physical exam   Patient resting in bed him unresponsive but in no discomfort the Coarse bilateral breath sounds Abdomen soft nontender No focal deficits.    I have discussed the plan with wife and other family members bedside. Continue DO NOT RESUSCITATE. Continue full comfort care only.      Previous progress note by hospitalist service      [facility] Progress Note [place] TEAM 1 - Stepdown/ICU TEAM   Andrew Church:[id number] DOB: May 31, 1946 DOA: 01-31-14 PCP: Andrew Huh, MD  Brief narrative:  Andrew Church is a 68 y.o. male presenting on 2014-01-31 with  has a past medical history of Neuropathy; Prediabetes; HTN (hypertension); Hyperlipemia; Melanoma; cardiovascular stress test; Fatty liver;  Diabetes mellitus; ED (erectile dysfunction); Hepatitis B carrier; CKD (chronic kidney disease), stage III; and PAF (paroxysmal atrial fibrillation) who presents with dyspnea. He underwent a lung biopsy for increasing dyspnea and hemoptysis a few wks ago by Dr Roxy Horseman and was found to have metastatic melanoma.  He was admitted as mentioned for increasing dyspnea, wheezing, coughing. He had svt in the ER and syncope (see H&P). He had lactic acidosis with anion gap acidosis. He was evaluated by PCCM on 3/9 and underwent a right sided paracentesis with removal of 1 L for symptom relief, however, this was ineffective in relieving symptoms. Dr Earnest Conroy feels that the respiratory distress is coming from airway narrowing from the tumor and has discussed irradiating it with Rad Onc. They have agreed to it and the patient has been transferred to [facility] in preparation for this. His oncologist will see him there as well. Of note, the patient has Hep B and Viral DNA is noted to be elevated.   Subjective: Quite agitated and sleepy at the same time. Initially asleep when I entered the room. Later asks to sit up. Have had long discussion with wife with Dr Earnest Conroy. She has agreed to not pursure life support or other extreme measures to keep him alive although, previously, the patient did want to be a full code.   Assessment/Plan: Principal Problem:   Acute respiratory failure - due to metastatic melanoma - details as above, PCCM following - starting low dose Oxycodone for generalized pain air hunger- if it helps, can start Oxycontin to help him rest at night.   Active Problems:   Melanoma- metastatic - Per Onc  Abdominal distension/ Unspecified constipation - cont to treat as he will now be on narcotics     Syncope - in ER - due to SVT? No brain mets    Metabolic acidosis - lactic acidosis    Sepsis- - I do not see a documented fever but procalcitonin and WBC count high - question bacterial translocation from severe  constipation - also note that pt was on steroids which were discontinued on 3/9    Atrial fibrillation with RVR - cardiology avoiding anticoagulation due to cancer masses and pervious hemoptysis - oral cardizem per cardiology    ARF (acute renal failure) - resolved    Hepatitis B - d/w GI - this is active hep B- recommended to add E antigen and antibody  HTN - ACE, Cardizem    Code Status: DNR Family Communication: with wife Disposition Plan: to be determined  Consultants: PCCM Rad Onc Heme/Onc Cardiology  Procedures: 3/9- right sided thoracentesis  Antibiotics: Antibiotics Given (last 72 hours)   Date/Time Action Medication Dose Rate   01/10/14 1312 Given   piperacillin-tazobactam (ZOSYN) IVPB 3.375 g 3.375 g 12.5 mL/hr   01/10/14 2119 Given   piperacillin-tazobactam (ZOSYN) IVPB 3.375 g 3.375 g 12.5 mL/hr   01/11/14 0520 Given   piperacillin-tazobactam (ZOSYN) IVPB 3.375 g 3.375 g 12.5 mL/hr   01/11/14 1407 Given   piperacillin-tazobactam (ZOSYN) IVPB 3.375 g 3.375 g 12.5 mL/hr   01/11/14 2230 Given   piperacillin-tazobactam (ZOSYN) IVPB 3.375 g 3.375 g 12.5 mL/hr   01/12/14 [id number] Given   piperacillin-tazobactam (ZOSYN) IVPB 3.375 g 3.375 g 12.5 mL/hr   01/12/14 1340 Given   piperacillin-tazobactam (ZOSYN) IVPB 3.375 g 3.375 g 12.5 mL/hr   01/12/14 2200 Given   piperacillin-tazobactam (ZOSYN) IVPB 3.375 g 3.375 g 12.5 mL/hr   01/13/14 0616 Given   piperacillin-tazobactam (ZOSYN) IVPB 3.375 g 3.375 g 12.5 mL/hr       DVT prophylaxis: SCDs  Objective: Filed Weights   01/16/2014 1730  Weight: 86.2 kg (190 lb 0.6 oz)   Blood pressure 163/101, pulse 88, temperature 97.4 F (36.3 C), temperature source Oral, resp. rate 13, height 5' 10.87" (1.8 m), weight 86.2 kg (190 lb 0.6 oz), SpO2 95.00%.  Intake/Output Summary (Last 24 hours) at 01/13/14 1041 Last data filed at 01/13/14 0815  Gross per 24 hour  Intake 543.33 ml  Output   1800 ml  Net -1256.67 ml      Exam: General: + acute respiratory distress with abdominal breathing  Lungs: upper airway wheeze and ronchi Cardiovascular: Regular rate and rhythm without murmur gallop or rub normal S1 and S2 Abdomen: Nontender, nondistended, soft, bowel sounds positive, no rebound, no ascites, no appreciable mass Extremities: No significant cyanosis, clubbing, or edema bilateral lower extremities  Data Reviewed: Basic Metabolic Panel:  Recent Labs Lab 01/06/2014 1150 01/09/14 0247 01/10/14 0343 01/11/14 0350 01/12/14 0616  NA 141 137 136* 139 139  K 4.0 4.5 4.1 4.3 4.5  CL 102 96 94* 98 98  CO2 19 16* 17* 20 23  GLUCOSE 235* 274* 213* 178* 137*  BUN 31* 31* 39* 48* 50*  CREATININE 1.17 1.29 1.43* 1.29 1.26  CALCIUM 9.8 9.6 10.0 9.9 9.7  MG 2.1  --   --   --   --    Liver Function Tests:  Recent Labs Lab 01/10/14 0343 01/11/14 0350 01/11/14 1610  AST 46* 39*  --   ALT 24 23  --   ALKPHOS 98 90  --   BILITOT  0.4 0.3  --   PROT 6.3 6.0 6.4  ALBUMIN 3.2* 3.1*  --    No results found for this basename: LIPASE, AMYLASE,  in the last 168 hours No results found for this basename: AMMONIA,  in the last 168 hours CBC:  Recent Labs Lab 01/09/2014 1150 01/09/14 0247 01/10/14 1923 01/11/14 0350 01/12/14 0616  WBC 13.1* 15.8* 19.2* 19.9* 18.0*  HGB 12.5* 11.7* 10.9* 10.9* 11.1*  HCT 34.5* 32.4* 30.7* 31.1* 31.7*  MCV 86.5 86.2 86.5 86.6 87.3  PLT 119* 110* 111* 104* 103*   Cardiac Enzymes:  Recent Labs Lab 01/20/2014 1614 01/12/2014 2236 01/09/14 0247  TROPONINI <0.30 <0.30 <0.30   BNP (last 3 results)  Recent Labs  01/05/2014 1546 01/09/14 0248  PROBNP 305.4* 1296.0*   CBG:  Recent Labs Lab 01/12/14 0731 01/12/14 1229 01/12/14 1412 01/12/14 1648 01/12/14 2218  GLUCAP 147* 100* 130* 146* 137*    Recent Results (from the past 240 hour(s))  MRSA PCR SCREENING     Status: None   Collection Time    01/07/2014  8:50 PM      Result Value Ref Range Status   MRSA  by PCR NEGATIVE  NEGATIVE Final   Comment:            The GeneXpert MRSA Assay (FDA     approved for NASAL specimens     only), is one component of a     comprehensive MRSA colonization     surveillance program. It is not     intended to diagnose MRSA     infection nor to guide or     monitor treatment for     MRSA infections.  CULTURE, BLOOD (ROUTINE X 2)     Status: None   Collection Time    01/09/14 12:00 PM      Result Value Ref Range Status   Specimen Description BLOOD RIGHT HAND   Final   Special Requests BOTTLES DRAWN AEROBIC AND ANAEROBIC 10CC   Final   Culture  Setup Time     Final   Value: 01/09/2014 19:21     Performed at [facility]   Culture     Final   Value:        BLOOD CULTURE RECEIVED NO GROWTH TO DATE CULTURE WILL BE HELD FOR 5 DAYS BEFORE ISSUING A FINAL NEGATIVE REPORT     Performed at [facility]   Report Status PENDING   Incomplete  CULTURE, BLOOD (ROUTINE X 2)     Status: None   Collection Time    01/09/14 12:10 PM      Result Value Ref Range Status   Specimen Description BLOOD RIGHT ARM   Final   Special Requests BOTTLES DRAWN AEROBIC ONLY 10CC   Final   Culture  Setup Time     Final   Value: 01/09/2014 19:21     Performed at [facility]   Culture     Final   Value:        BLOOD CULTURE RECEIVED NO GROWTH TO DATE CULTURE WILL BE HELD FOR 5 DAYS BEFORE ISSUING A FINAL NEGATIVE REPORT     Performed at [facility]   Report Status PENDING   Incomplete  AFB CULTURE WITH SMEAR     Status: None   Collection Time    01/11/14  2:53 PM      Result Value Ref Range Status   Specimen Description FLUID RIGHT PLEURAL  Final   Special Requests Normal   Final   ACID FAST SMEAR     Final   Value: NO ACID FAST BACILLI SEEN     Performed at [facility]   Culture     Final   Value: CULTURE WILL BE EXAMINED FOR 6 WEEKS BEFORE ISSUING A FINAL REPORT     Performed at [facility]   Report Status PENDING    Incomplete  BODY FLUID CULTURE     Status: None   Collection Time    01/11/14  2:53 PM      Result Value Ref Range Status   Specimen Description FLUID RIGHT PLEURAL   Final   Special Requests Normal   Final   Gram Stain     Final   Value: RARE WBC PRESENT,BOTH PMN AND MONONUCLEAR     NO ORGANISMS SEEN     Performed at [facility]   Culture     Final   Value: NO GROWTH     Performed at [facility]   Report Status PENDING   Incomplete  MRSA PCR SCREENING     Status: None   Collection Time    01/12/14  3:36 PM      Result Value Ref Range Status   MRSA by PCR NEGATIVE  NEGATIVE Final   Comment:            The GeneXpert MRSA Assay (FDA     approved for NASAL specimens     only), is one component of a     comprehensive MRSA colonization     surveillance program. It is not     intended to diagnose MRSA     infection nor to guide or     monitor treatment for     MRSA infections.     Studies:  Recent x-ray studies have been reviewed in detail by the Attending Physician  Scheduled Meds:  Scheduled Meds: . diltiazem  120 mg Oral Daily  . furosemide  20 mg Intravenous BID  . levalbuterol  1.25 mg Nebulization 4 times per day  . pantoprazole (PROTONIX) IV  40 mg Intravenous QHS  . pregabalin  75 mg Oral BID  . sodium chloride  3 mL Intravenous Q12H   Continuous Infusions: . morphine 2 mg/hr (01/12/14 2129)    Time spent on care of this patient: >35 min   [name], MD  [facility] Office  [phone] Pager - Text Page per Shea Evans as per below:  On-Call/Text Page:      [email or web address]  If 7PM-7AM, please contact night-coverage [email or web address] 01/13/2014, 10:41 AM   LOS: 5 days

## 2014-01-13 NOTE — Progress Notes (Signed)
Patient was transferred in from icu at 1600. Patient sleeping since he came in , family at bedside.RN from icu reported that he had no urinary output all day, condom cath in place.Parker family expressing concerns about care,expecting more aggressive care, explained to family that it is now transitioned to comfort care,family not accepting , Dr Hilma Favors paged by RN so further explanation can be given to family.at 1845 still no voiding, bladder felt firm, wife refused foley cath, and said pt's belly always firm and distended like that, bladder scan performed and reading was 749 ml,daughters agreed to have foley cath insertion, obtained 950 ml of yellow urine, patient woke up and got agitated post foley inserted, DR Hilma Favors in room , talked to family,ativan 2 mgs given plus morphine bolus of 10 mgs  given, patient calmed down and slept .[employer] snack cart also delivered to room.

## 2014-01-13 NOTE — Progress Notes (Signed)
Notes from Palliative Care reviewed.  Family desires full comfort.  He is pending transfer out of ICU to palliative care floor.  PCCM will be available PRN if needs arise.     Noe Gens, NP-C [facility] Pgr: [phone] or [phone]

## 2014-01-13 NOTE — Consult Note (Signed)
Family desire full comfort. Met with wife, daughter to discuss achieving dignity, comfort and peace as Andrew Church approaches EOL.  1. Move from ICU, consider transitioning to GIP hospice today-this family will greatly benefit from Hospice services.  2. D/c tele, adjust morphine infusion, add nebulizers, comfort feeding, ativan for agitation  3. Call chaplain  4. Prognosis: hours-couple of days  Dx: Malignant Melanoma, Lung metastasis  Full consult to follow.  Lane Hacker, DO Palliative Medicine

## 2014-01-13 NOTE — Progress Notes (Signed)
Pt able to stand to use urinal but very weak. Informed pt's wife that RN would place condom cath d/t pt's weakness. Pt's wife stated ok. Will continue to monitor.

## 2014-01-14 ENCOUNTER — Ambulatory Visit (HOSPITAL_COMMUNITY): Payer: BC Managed Care – PPO

## 2014-01-14 DIAGNOSIS — Z515 Encounter for palliative care: Secondary | ICD-10-CM

## 2014-01-14 LAB — ANA, BODY FLUID: Anti-Nuclear Ab, IgG: NOT DETECTED

## 2014-01-14 MED ORDER — GLYCOPYRROLATE 0.2 MG/ML IJ SOLN
0.1000 mg | INTRAMUSCULAR | Status: DC | PRN
  Filled 2014-01-14: qty 0.5

## 2014-01-14 NOTE — Progress Notes (Signed)
Palliative Care Team at [place] Note   SUBJECTIVE: Resting comfortably. No distress. Mrs. Sprenkle at bedside-she got a good nights sleep. We discussed events from last night and she is much more pleased with his care. High family emotions and conflict last PM with exhaustion made for a difficult transition out of teh unit along with patient's urinary retention and terminal agitation.  Interval Events: Comfort Care transition 3/11  OBJECTIVE: Vital Signs: BP 113/75  Pulse 106  Temp(Src) 98.1 F (36.7 C) (Axillary)  Resp 16  Ht 5\' 10"  (1.778 m)  Wt 86.2 kg (190 lb 0.6 oz)  BMI 26.60 kg/m2  SpO2 95%   Intake and Output: 03/11 0701 - 03/12 0700 In: 30 [I.V.:30] Out: 1450 [Urine:1450]  Physical Exam: General: Vital signs reviewed and noted. Well-developed, well-nourished, in no acute distress; increasing chest/throat congestion  Head: Normocephalic, atraumatic.  Lungs:  Regular rate, some use of abdominal muscles, +rhonchi r>l  Heart: Tachycardic. S1 and S2 normal without gallop,  or rubs. (+) murmur  Abdomen:  BS normoactive. Soft, Nondistended, non-tender.  No masses or organomegaly.  Extremities: + edema.    Allergies  Allergen Reactions  . Penicillins Rash    Tolerates Zosyn    Medications: Scheduled Meds:  . diltiazem  120 mg Oral Daily  . furosemide  20 mg Intravenous BID  . levalbuterol  1.25 mg Nebulization Q6H  . pantoprazole (PROTONIX) IV  40 mg Intravenous QHS  . pregabalin  75 mg Oral BID  . scopolamine  1 patch Transdermal Q72H  . sodium chloride  3 mL Intravenous Q12H    Continuous Infusions: . morphine 2 mg/hr (01/13/14 2048)    PRN Meds: sodium chloride, atropine, hydrALAZINE, LORazepam, morphine injection, ondansetron (ZOFRAN) IV, ondansetron, sodium chloride, sodium chloride  Stool Softner: dulcolax  Palliative Performance Scale: 10%  Labs: CBC    Component Value Date/Time   WBC 18.0* 01/12/2014 0616   WBC 4.8 06/05/2012 1309    RBC 3.63* 01/12/2014 0616   RBC 4.33 06/05/2012 1309   HGB 11.1* 01/12/2014 0616   HGB 13.6 06/05/2012 1309   HCT 31.7* 01/12/2014 0616   HCT 39.9 06/05/2012 1309   PLT 103* 01/12/2014 0616   PLT 120* 06/05/2012 1309   MCV 87.3 01/12/2014 0616   MCV 92.1 06/05/2012 1309   MCH 30.6 01/12/2014 0616   MCH 31.4 06/05/2012 1309   MCHC 35.0 01/12/2014 0616   MCHC 34.1 06/05/2012 1309   RDW 13.1 01/12/2014 0616   RDW 14.0 06/05/2012 1309   LYMPHSABS 1.2 06/05/2012 1309   MONOABS 0.3 06/05/2012 1309   EOSABS 0.1 06/05/2012 1309   BASOSABS 0.0 06/05/2012 1309    CMET     Component Value Date/Time   NA 139 01/12/2014 0616   K 4.5 01/12/2014 0616   CL 98 01/12/2014 0616   CO2 23 01/12/2014 0616   GLUCOSE 137* 01/12/2014 0616   BUN 50* 01/12/2014 0616   CREATININE 1.26 01/12/2014 0616   CALCIUM 9.7 01/12/2014 0616   PROT 6.4 01/11/2014 1610   ALBUMIN 3.1* 01/11/2014 0350   AST 39* 01/11/2014 0350   ALT 23 01/11/2014 0350   ALKPHOS 90 01/11/2014 0350   BILITOT 0.3 01/11/2014 0350   GFRNONAA 57* 01/12/2014 0616   GFRAA 65* 01/12/2014 0616    ASSESSMENT/ PLAN: 68 yo actively dying of terminal metastatic melanoma with lung mets and acute decline.  Symptoms: 1. Dyspnea/Respirtaory Failure due to Lung Mets/PNA 2. Terminal Agitation 3. Encephalopathy 4. Urinary retension  Transferred to [facility] yesterday afternoon  Morphine infusion at 5mg /hr  Ativan 1-2 mg q4 prn  Patient on NRB--I discussed changing to nasal cannula-wife agreeable-waiting on adopted son to arrive this evening.  I suggested a referral to Hospice for consideration of GIP/HIP-wife agreeable.  Maintain foley  Listened and provided support to wife at bedside.    Time In: 910 Time Out: 1010 Total Time Spent with Patient: 60 minutes Total Overall Time:    Greater than 50%  of this time was spent counseling and coordinating care related to the above assessment and plan.   Acquanetta Chain, DO  01/14/2014, 9:55 AM  Please contact Palliative Medicine  Team phone at [phone] for questions and concerns.

## 2014-01-14 NOTE — Care Management Note (Signed)
Patient screened for GIP referral. Pt inappropriate for GIP due to Medicare not being pt's primary insurance payer. Attending physician and Palliative Medicine Team made aware.    Venita Lick Brownie Gockel,MSN,RN [phone]

## 2014-01-14 NOTE — Progress Notes (Signed)
Patient Demographics  Andrew Church, is a 68 y.o. male, DOB - [birth year], [id number]  Admit date - 01/23/2014   Admitting Physician Belkys A Harrel Carina, MD  Outpatient Primary MD for the patient is Simona Huh, MD  LOS - 6   Chief Complaint  Patient presents with  . 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 Patient was transferred to [facility] for oncology input and radiation oncology input, he was seen by oncologist Dr.Magrinat and after detailed discussion with family, patient and palliative care it was decided that patient will be best served with full comfort care which was started on 01/12/2014. He should today appears to be unresponsive but comfortable, family by bedside, they agree with the plan a full comfort care, he is currently on IV morphine and IV Ativan. All other medications have been stopped except medications for comfort. Goal of care is full comfort care. Expect death soon.    For other issues addressed earlier during this admission see the progress note from the hospitalist service on 01/12/2014     Code Status: DNR  Family Communication: Wife and daughter  Disposition Plan: Assurance declined GIP   Procedures     Consults Pall care   Medications  Scheduled Meds: . diltiazem  120 mg Oral Daily  . furosemide  20 mg Intravenous BID  .  levalbuterol  1.25 mg Nebulization Q6H  . pantoprazole (PROTONIX) IV  40 mg Intravenous QHS  . pregabalin  75 mg Oral BID  . scopolamine  1 patch Transdermal Q72H  . sodium chloride  3 mL Intravenous Q12H   Continuous Infusions: . morphine 2 mg/hr (01/13/14 2048)   PRN Meds:.sodium chloride, atropine, hydrALAZINE, LORazepam, morphine injection, ondansetron (ZOFRAN) IV, ondansetron, sodium chloride, sodium chloride  DVT Prophylaxis  SCDs   Lab Results  Component Value Date   PLT 103* 01/12/2014    Antibiotics   Anti-infectives   Start     Dose/Rate Route Frequency Ordered Stop   01/09/14 1400  piperacillin-tazobactam (ZOSYN) IVPB 3.375 g  Status:  Discontinued     3.375 g 12.5 mL/hr over 240 Minutes Intravenous 3 times per day 01/09/14 1241 01/13/14 1029          Subjective:   Andrew Church today is resting in bed, he is unresponsive but appears to be in no distress. Taking rapid deep breaths   Objective:   Filed Vitals:   01/14/14 0030 01/14/14 0330 01/14/14 0453 01/14/14 0755  BP:   113/75   Pulse:   106   Temp:   98.1 F (36.7 C)   TempSrc:   Axillary   Resp:   16   Height:      Weight:      SpO2: 91% 93% 94% 95%    Wt Readings from Last 3 Encounters:  01/14/2014 86.2 kg (190 lb 0.6 oz)  01/05/14  86.183 kg (190 lb)  11/23/13 86.41 kg (190 lb 8 oz)     Intake/Output Summary (Last 24 hours) at 01/14/14 1118 Last data filed at 01/14/14 0501  Gross per 24 hour  Intake     16 ml  Output   1450 ml  Net  -1434 ml     Physical Exam  lying in bed unresponsive taking deep breaths and does not appear to be in discomfort [place].AT,PERRAL Supple Neck,No JVD, No cervical lymphadenopathy appriciated.  Symmetrical Chest wall movement, Good air movement bilaterally, coarse bilateral breath sounds with rales RRR,No Gallops,Rubs or new Murmurs, No Parasternal Heave +ve B.Sounds, Abd Soft, Non tender, No organomegaly appriciated, No rebound - guarding or rigidity. No  Cyanosis, Clubbing or edema, No new Rash or bruise    Data Review   Micro Results Recent Results (from the past 240 hour(s))  MRSA PCR SCREENING     Status: None   Collection Time    01/07/2014  8:50 PM      Result Value Ref Range Status   MRSA by PCR NEGATIVE  NEGATIVE Final   Comment:            The GeneXpert MRSA Assay (FDA     approved for NASAL specimens     only), is one component of a     comprehensive MRSA colonization     surveillance program. It is not     intended to diagnose MRSA     infection nor to guide or     monitor treatment for     MRSA infections.  CULTURE, BLOOD (ROUTINE X 2)     Status: None   Collection Time    01/09/14 12:00 PM      Result Value Ref Range Status   Specimen Description BLOOD RIGHT HAND   Final   Special Requests BOTTLES DRAWN AEROBIC AND ANAEROBIC 10CC   Final   Culture  Setup Time     Final   Value: 01/09/2014 19:21     Performed at [facility]   Culture     Final   Value:        BLOOD CULTURE RECEIVED NO GROWTH TO DATE CULTURE WILL BE HELD FOR 5 DAYS BEFORE ISSUING A FINAL NEGATIVE REPORT     Performed at [facility]   Report Status PENDING   Incomplete  CULTURE, BLOOD (ROUTINE X 2)     Status: None   Collection Time    01/09/14 12:10 PM      Result Value Ref Range Status   Specimen Description BLOOD RIGHT ARM   Final   Special Requests BOTTLES DRAWN AEROBIC ONLY 10CC   Final   Culture  Setup Time     Final   Value: 01/09/2014 19:21     Performed at [facility]   Culture     Final   Value:        BLOOD CULTURE RECEIVED NO GROWTH TO DATE CULTURE WILL BE HELD FOR 5 DAYS BEFORE ISSUING A FINAL NEGATIVE REPORT     Performed at [facility]   Report Status PENDING   Incomplete  AFB CULTURE WITH SMEAR     Status: None   Collection Time    01/11/14  2:53 PM      Result Value Ref Range Status   Specimen Description FLUID RIGHT PLEURAL   Final   Special Requests Normal   Final   ACID FAST SMEAR  Final   Value: NO ACID FAST BACILLI SEEN     Performed at [facility]   Culture     Final   Value: CULTURE WILL BE EXAMINED FOR 6 WEEKS BEFORE ISSUING A FINAL REPORT     Performed at [facility]   Report Status PENDING   Incomplete  BODY FLUID CULTURE     Status: None   Collection Time    01/11/14  2:53 PM      Result Value Ref Range Status   Specimen Description FLUID RIGHT PLEURAL   Final   Special Requests Normal   Final   Gram Stain     Final   Value: RARE WBC PRESENT,BOTH PMN AND MONONUCLEAR     NO ORGANISMS SEEN     Performed at [facility]   Culture     Final   Value: NO GROWTH 1 DAY     Performed at [facility]   Report Status PENDING   Incomplete  FUNGUS CULTURE W SMEAR     Status: None   Collection Time    01/11/14  2:53 PM      Result Value Ref Range Status   Specimen Description FLUID RIGHT PLEURAL   Final   Special Requests Normal   Final   Fungal Smear     Final   Value: NO YEAST OR FUNGAL ELEMENTS SEEN     Performed at [facility]   Culture     Final   Value: CULTURE IN PROGRESS FOR FOUR WEEKS     Performed at [facility]   Report Status PENDING   Incomplete  MRSA PCR SCREENING     Status: None   Collection Time    01/12/14  3:36 PM      Result Value Ref Range Status   MRSA by PCR NEGATIVE  NEGATIVE Final   Comment:            The GeneXpert MRSA Assay (FDA     approved for NASAL specimens     only), is one component of a     comprehensive MRSA colonization     surveillance program. It is not     intended to diagnose MRSA     infection nor to guide or     monitor treatment for     MRSA infections.    Radiology Reports Ct Abdomen Pelvis Wo Contrast  01/05/2014   CLINICAL DATA:  Constipation. Hepatitis B. 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  EXAM: CT ABDOMEN AND PELVIS WITHOUT CONTRAST  TECHNIQUE: Multidetector CT imaging of the abdomen and pelvis was performed following the standard protocol without  intravenous contrast.  COMPARISON:  DG ABD ACUTE W/CHEST dated 01/16/2014  FINDINGS: Unchanged chest findings of mediastinal adenopathy, bilateral pleural effusions, and left lower lobe mass. Hepatic steatosis. Normal spleen, gallbladder, pancreas, and kidneys.  8 x 16 mm left adrenal nodularity, nonspecific, but changed from 2007, not visibly hypervascular, metastasis versus small adenoma (favored) are considerations.  Normal kidneys ureters, and bladder. No retroperitoneal adenopathy. No bowel obstruction or bowel wall thickening. Moderate gastric distention of a gaseous nature, but no visible outlet obstruction. Moderate stool burden. No osseous findings. Chronic bone island in T12. Superficial soft tissues unremarkable. No pathologically enlarged inguinal adenopathy or hernia. No appendiceal inflammation. Mild prostate enlargement.  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  8 x 16 mm left adrenal nodularity, nonspecific, but interval change since 2007.  No acute intra-abdominal findings.   Electronically Signed   By: Rolla Flatten M.D.   On: 01/14/2014 20:23   Chest 2 View  01/06/2014   CLINICAL DATA:  Lung mass.  Preop.  EXAM: CHEST  2 VIEW  COMPARISON:  12/29/2013  FINDINGS: Unchanged appearance of left infrahilar mass with bilateral mediastinal lymphadenopathy. There are small bilateral pleural effusions, also seen previously. No edema or consolidation. Normal heart size. No acute osseous findings.  IMPRESSION: 1. Likely malignant left lung mass and lymphadenopathy. 2. Trace bilateral pleural effusions, unchanged from 12/29/2013.   Electronically Signed   By: Jorje Guild M.D.   On: 01/06/2014 07:16   Dg Chest 2 View  12/29/2013   CLINICAL DATA:  Cough, congestion, shortness of breath for 2 weeks, evaluate for pneumonia  EXAM: CHEST  2 VIEW  COMPARISON:  DG CHEST 1V PORT dated 09/03/2013; DG CHEST 2 VIEW dated 06/05/2012; DG CHEST 2 VIEW dated  06/07/2011  FINDINGS: Grossly unchanged borderline enlarged cardiac silhouette. Interval development of an approximately 4.9 x 4.2 cm apparent mass within the superior segment of the left lower lobe, best appreciated on the provided lateral radiograph. This finding is associated with apparent nodular thickening of the contralateral right paratracheal stripe. The lungs remain hyperexpanded with flattening of bilateral hemidiaphragms and mild diffuse slightly nodular thickening of the pulmonary interstitium. Interval development of trace bilateral pleural effusions. Worsening left basilar heterogeneous/consolidative opacities. No evidence of edema. No pneumothorax. Grossly unchanged bones.  IMPRESSION: 1. Interval development of an approximately 4.9 cm mass within the superior segment left lower lobe with possible adenopathy within in the contralateral right mediastinum. Further evaluation with contrast-enhanced chest CT is recommended. 2. Worsening left basilar/retrocardiac opacities, atelectasis versus infiltrate. 3. Interval development of trace bilateral pleural effusions. No evidence of edema. These results will be called to the ordering clinician or representative by the Radiologist Assistant, and communication documented in the PACS Dashboard.   Electronically Signed   By: Sandi Mariscal M.D.   On: 12/29/2013 16:56   Ct Chest W Contrast  12/31/2013   CLINICAL DATA:  Abnormal chest radiograph with possible adenopathy, chest pain, shortness of breath.  EXAM: CT CHEST WITH CONTRAST  TECHNIQUE: Multidetector CT imaging of the chest was performed during intravenous contrast administration.  CONTRAST:  77mL OMNIPAQUE IOHEXOL 300 MG/ML  SOLN  COMPARISON:  Chest radiographs dated 12/29/2013.  FINDINGS: 5.0 x 4.4 cm central left lower lobe mass (series 3/image 39), suspicious for primary bronchogenic neoplasm. Mass extends into the left hilar region (series 2/ image 34). Narrowing of the left lower lobe bronchus.  Small  right and trace left pleural effusions.  No pneumothorax.  Visualized thyroid is unremarkable.  The heart is normal in size. No pericardial effusion. Mild coronary atherosclerosis in the LAD. No evidence of thoracic aortic aneurysm.  Associated mediastinal lymphadenopathy, including:  --8 mm short axis right supraclavicular node (series 2/image 9)  --20 mm short axis high right paratracheal node (series 2/image 21)  --13 mm short axis right hilar node (series 2/image 32)  --31 mm short axis subcarinal node (series 2/image 34)  Visualized upper abdomen is unremarkable. Bilateral adrenal glands are within normal limits.  Degenerative changes of the visualized thoracolumbar spine. Probable bone island at T12 (sagittal image 79).  IMPRESSION: 5.0 cm central left lower lobe mass, suspicious for primary bronchogenic neoplasm.  Associated thoracic lymphadenopathy, as described above, including a prominent 8 mm short axis right supraclavicular node.  Small right and trace left pleural effusions.  For tissue diagnosis, consider bronchoscopy or percutaneous sampling of the right supraclavicular node.  These results will be called to the ordering clinician or representative by the Radiologist Assistant, and communication documented in the PACS Dashboard.   Electronically Signed   By: Julian Hy M.D.   On: 12/31/2013 14:36   Ct Angio Chest Pe W/cm &/or Wo Cm  01/28/2014   CLINICAL DATA:  Left lower lobe mass. Status post bronchoscopy and biopsy 2 days ago. Worsening shortness of breath. Hemoptysis.  EXAM: CT ANGIOGRAPHY CHEST WITH CONTRAST  TECHNIQUE: Multidetector CT imaging of the chest was performed using the standard protocol during bolus administration of intravenous contrast. Multiplanar CT image reconstructions and MIPs were obtained to evaluate the vascular anatomy.  CONTRAST:  70 mL OMNIPAQUE IOHEXOL 350 MG/ML SOLN  COMPARISON:  CT chest 12/31/2013. Chest in two views abdomen earlier this same day.  FINDINGS:  There is a moderate right and small left pleural effusion. No pericardial effusion is identified. No pulmonary embolus is identified. The descending left interlobar and ascending right interlobar pulmonary arteries are narrowed by the patient's left lower lobe mass and mediastinal adenopathy.  Bulky mediastinal lymphadenopathy is unchanged. Again seen is a 2.0 cm right paratracheal node on image 28, a 3.1 cm subcarinal node on image 47 and a 2.7 cm left subcarinal lymph node on image 52. 5 cm in diameter left lower lobe mass is again identified. Increased compressive atelectasis is seen in the bases. There is a new nodular opacity in the right upper lobe measuring 1.1 x 0.8 cm on image 27. Incidentally imaged upper abdomen is unremarkable. No focal bony abnormality is identified.  Review of the MIP images confirms the above findings.  IMPRESSION: Negative for pulmonary embolus. Both the descending left interlobar and ascending right interlobar pulmonary arteries are compressed by lymphadenopathy and the left lower lobe mass.  No change in bulky mediastinal lymphadenopathy and a left lower lobe pulmonary mass.  Some increase in a moderate right and small left pleural effusion.  1.1 cm nodular opacity right upper lobe is new since CT scan 2 weeks ago and indeterminate. Recommend attention on follow-up exams.   Electronically Signed   By: Inge Rise M.D.   On: 01/25/2014 14:33   Mr Jeri Cos [id number] Contrast  01/10/2014   CLINICAL DATA:  Metastatic melanoma.  EXAM: MRI HEAD WITHOUT AND WITH CONTRAST  TECHNIQUE: Multiplanar, multiecho pulse sequences of the brain and surrounding structures were obtained without and with intravenous contrast.  CONTRAST:  65mL MULTIHANCE GADOBENATE DIMEGLUMINE 529 MG/ML IV SOLN  COMPARISON:  Head CT 05/12/2005  FINDINGS: There is no acute infarct. There is no evidence of intracranial hemorrhage, mass, midline shift, or extra-axial fluid collection. There is no abnormal enhancement. There  is mild generalized cerebral atrophy. Small, scattered foci of T2 hyperintensity are present in the deep cerebral white matter bilaterally, nonspecific but compatible with mild chronic small vessel ischemic disease.  Orbits are unremarkable. Paranasal sinuses and mastoid air cells are clear. Major intracranial vascular flow voids are preserved. There is heterogeneous marrow signal at C2 involving the dens.  IMPRESSION: 1. No evidence of intracranial metastatic disease or acute intracranial abnormality. 2. Heterogeneous marrow signal in the dens, possibly degenerative although metastatic disease cannot be completely excluded. 3. Mild chronic small vessel ischemic disease.   Electronically Signed   By: Logan Bores   On: 01/10/2014 11:57   Dg Chest Port 1 View  01/12/2014   CLINICAL DATA:  Respiratory distress.  EXAM: PORTABLE CHEST - 1 VIEW  COMPARISON:  DG CHEST 1V PORT dated 01/11/2014; CT ANGIO CHEST W/CM &/OR WO/CM dated 01/16/2014  FINDINGS: Cardiac silhouette appears mildly enlarged, similar prominent mediastinal silhouette. Slightly decreased central pulmonary vasculature congestion with persistent left lung base airspace opacity and small left pleural effusion. No pneumothorax.  Multiple EKG lines overlie the patient and may obscure subtle underlying pathology. Mild degenerative change of the thoracic spine. Gaseous distended stomach.  IMPRESSION: Mild cardiomegaly, slightly decreased central pulmonary vasculature congestion with persistent left lung base airspace opacity and small pleural effusion.  Mildly prominent mediastinal silhouette corresponding to known lymphadenopathy.   Electronically Signed   By: Elon Alas   On: 01/12/2014 01:28   Dg Chest Port 1 View  01/11/2014   CLINICAL DATA:  Post thoracentesis  EXAM: PORTABLE CHEST - 1 VIEW  COMPARISON:  Portable exam [id number] hr compared to 01/10/2014 ; correlation CT chest 02/02/2014  FINDINGS: Normal heart size and pulmonary vascularity.  Prominent  right peritracheal soft tissues and prominent left hilum corresponding to adenopathy on prior CT. Atelectasis versus consolidation left lower lobe.  Minimal right basilar atelectasis.  Persistent left pleural effusion.  No pneumothorax.  IMPRESSION: No pneumothorax following thoracentesis.  Persistent left pleural effusion and left lower lobe atelectasis versus consolidation.  Thoracic adenopathy.   Electronically Signed   By: Lavonia Dana M.D.   On: 01/11/2014 17:10   Dg Chest Port 1 View  01/10/2014   CLINICAL DATA:  Assess infiltrates.  EXAM: PORTABLE CHEST - 1 VIEW  COMPARISON:  DG CHEST 1V PORT dated 01/09/2014; CT ANGIO CHEST W/CM &/OR WO/CM dated 01/16/2014  FINDINGS: Stable enlargement of the mediastinum is consistent with lymphadenopathy. Increased densities in the left lung could represent edema or layering pleural fluid. Persistent densities in the medial right lung base. Stable consolidation in the retrocardiac space.  IMPRESSION: Increased densities in the left lung could represent edema or layering fluid.  Persistent consolidation in the retrocardiac space.  Mediastinal lymphadenopathy.   Electronically Signed   By: Markus Daft M.D.   On: 01/10/2014 08:27   Dg Chest Port 1 View  01/09/2014   CLINICAL DATA:  Shortness of breath.  EXAM: PORTABLE CHEST - 1 VIEW  COMPARISON:  01/13/2014  FINDINGS: Chest radiograph demonstrates persistent densities in the medial left lower lung. Findings are compatible with volume loss and consolidation. Heart size is grossly stable. The trachea is midline. Again noted is widening of the mediastinum related to the known lymphadenopathy.  IMPRESSION: Persistent consolidation in the medial left lower lung.  Mediastinal lymphadenopathy.   Electronically Signed   By: Markus Daft M.D.   On: 01/09/2014 14:35   Dg Abd Acute W/chest  02/01/2014   CLINICAL DATA:  Shortness of breath.  Abdominal pain and distention.  EXAM: ACUTE ABDOMEN SERIES (ABDOMEN 2 VIEW & CHEST 1 VIEW)   COMPARISON:  CT chest 12/31/2013 and PA and lateral chest 01/06/2014.  FINDINGS: Single view of the chest demonstrates bulky mediastinal lymphadenopathy. Left lower lobe mass is seen as on the prior study. There are small bilateral pleural effusions. No pneumothorax.  Two views of the abdomen show no free intraperitoneal air. The stomach appears distended. There is no evidence of small bowel obstruction. Large volume of stool throughout the colon is noted.  IMPRESSION: Left lower lobe mass and mediastinal lymphadenopathy as seen on prior CT. Small bilateral pleural effusions are identified.  Negative for free intraperitoneal air or bowel obstruction.  Gaseous distention of the  stomach.  Large volume of stool throughout the colon.   Electronically Signed   By: Inge Rise M.D.   On: 01/16/2014 13:51    CBC  Recent Labs Lab 01/23/2014 1150 01/09/14 0247 01/10/14 1923 01/11/14 0350 01/12/14 0616  WBC 13.1* 15.8* 19.2* 19.9* 18.0*  HGB 12.5* 11.7* 10.9* 10.9* 11.1*  HCT 34.5* 32.4* 30.7* 31.1* 31.7*  PLT 119* 110* 111* 104* 103*  MCV 86.5 86.2 86.5 86.6 87.3  MCH 31.3 31.1 30.7 30.4 30.6  MCHC 36.2* 36.1* 35.5 35.0 35.0  RDW 13.1 12.8 12.9 12.8 13.1    Chemistries   Recent Labs Lab 01/13/2014 1150 01/09/14 0247 01/10/14 0343 01/11/14 0350 01/12/14 0616  NA 141 137 136* 139 139  K 4.0 4.5 4.1 4.3 4.5  CL 102 96 94* 98 98  CO2 19 16* 17* 20 23  GLUCOSE 235* 274* 213* 178* 137*  BUN 31* 31* 39* 48* 50*  CREATININE 1.17 1.29 1.43* 1.29 1.26  CALCIUM 9.8 9.6 10.0 9.9 9.7  MG 2.1  --   --   --   --   AST  --   --  46* 39*  --   ALT  --   --  24 23  --   ALKPHOS  --   --  98 90  --   BILITOT  --   --  0.4 0.3  --    ------------------------------------------------------------------------------------------------------------------ estimated creatinine clearance is 57.1 ml/min (by C-G formula based on Cr of  1.26). ------------------------------------------------------------------------------------------------------------------ No results found for this basename: HGBA1C,  in the last 72 hours ------------------------------------------------------------------------------------------------------------------  Recent Labs  01/11/14 1610  CHOL 172   ------------------------------------------------------------------------------------------------------------------ No results found for this basename: TSH, T4TOTAL, FREET3, T3FREE, THYROIDAB,  in the last 72 hours ------------------------------------------------------------------------------------------------------------------ No results found for this basename: VITAMINB12, FOLATE, FERRITIN, TIBC, IRON, RETICCTPCT,  in the last 72 hours  Coagulation profile No results found for this basename: INR, PROTIME,  in the last 168 hours  No results found for this basename: DDIMER,  in the last 72 hours  Cardiac Enzymes  Recent Labs Lab 01/09/2014 1614 01/10/2014 2236 01/09/14 0247  TROPONINI <0.30 <0.30 <0.30   ------------------------------------------------------------------------------------------------------------------ No components found with this basename: POCBNP,      Time Spent in minutes   35   Lala Lund K M.D on 01/14/2014 at 11:18 AM  Between 7am to 7pm - Pager - [phone]  After 7pm go to [email or web address] - password [email or web address]  And look for the night coverage person covering for me after hours  [facility] Office  [phone]

## 2014-01-15 LAB — CULTURE, BLOOD (ROUTINE X 2)
CULTURE: NO GROWTH
Culture: NO GROWTH

## 2014-01-15 LAB — HEPATITIS B E ANTIBODY: Hep B E Ab: REACTIVE — AB

## 2014-01-15 LAB — BODY FLUID CULTURE
Culture: NO GROWTH
Special Requests: NORMAL

## 2014-01-16 NOTE — Discharge Summary (Signed)
[facility] Death Note                                                                                          Death Note please see Last Note for all details.   [name],NAT:[id number] is a 68 y.o. 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   [name] M.D on 01/16/2014 at 5:58 PM  [facility] Office Phone [phone]  Total clinical and documentation time for today - 30 minutes   Last Note                                                                                           Patient Demographics  Andrew Church, is a 68 y.o. male, DOB - 1946/01/27, GBT:[id number]  Admit date - 01/29/2014   Admitting Physician Belkys A Harrel Carina, MD  Outpatient Primary MD for the patient is Simona Huh, MD  LOS - 7   Chief Complaint  Patient presents with  . 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 Patient was transferred to [facility] for oncology input and radiation oncology input, he was seen by oncologist Dr.Magrinat and after detailed discussion with family, patient and palliative care it was decided that patient will be best served with full comfort care which was started on 01/12/2014. He should today appears to  be unresponsive but comfortable, family by bedside, they agree with the plan a full comfort care, he is currently on IV morphine and IV Ativan. All other medications have been stopped except medications for comfort. Goal of care is full comfort care. Expect death soon.    For other issues addressed earlier during this admission see the progress note from the hospitalist service on 01/12/2014     Code Status: DNR  Family Communication: Wife and daughter  Disposition Plan: Insurance declined GIP   Procedures     Consults Pall care   Medications  Scheduled Meds:  Continuous Infusions:  PRN Meds:.    DVT Prophylaxis  SCDs   Lab Results  Component Value Date   PLT 103* 01/12/2014    Antibiotics   Anti-infectives   Start  Dose/Rate Route Frequency Ordered Stop   01/09/14 1400  piperacillin-tazobactam (ZOSYN) IVPB 3.375 g  Status:  Discontinued     3.375 g 12.5 mL/hr over 240 Minutes Intravenous 3 times per day 01/09/14 1241 01/13/14 1029          Subjective:    [name] today is resting in bed, he is unresponsive but appears to be in no distress. Taking rapid deep breaths, coarse breath sounds.    Objective:   Filed Vitals:   01/06/2014 0210 01/20/2014 0502 01/26/2014 1014 01/29/2014 2100  BP:  121/66    Pulse:  108    Temp:  99.4 F (37.4 C)    TempSrc:  Axillary    Resp:  16    Height:      Weight:    86.2 kg (190 lb 0.6 oz)  SpO2: 93% 93% 89%     Wt Readings from Last 3 Encounters:  01/16/2014 86.2 kg (190 lb 0.6 oz)  01/05/14 86.183 kg (190 lb)  11/23/13 86.41 kg (190 lb 8 oz)     Intake/Output Summary (Last 24 hours) at 01/16/14 1758 Last data filed at 01/16/2014 1900  Gross per 24 hour  Intake      0 ml  Output   1000 ml  Net  -1000 ml     Physical Exam   Lying in bed unresponsive taking deep breaths and does not appear to be in discomfort [place].AT,PERRAL Supple Neck,No JVD, No cervical lymphadenopathy appriciated.  Symmetrical  Chest wall movement, Good air movement bilaterally, coarse bilateral breath sounds with rales RRR,No Gallops,Rubs or new Murmurs, No Parasternal Heave +ve B.Sounds, Abd Soft, Non tender, No organomegaly appriciated, No rebound - guarding or rigidity. No Cyanosis, Clubbing or edema, No new Rash or bruise     Data Review   Micro Results Recent Results (from the past 240 hour(s))  MRSA PCR SCREENING     Status: None   Collection Time    01/16/2014  8:50 PM      Result Value Ref Range Status   MRSA by PCR NEGATIVE  NEGATIVE Final   Comment:            The GeneXpert MRSA Assay (FDA     approved for NASAL specimens     only), is one component of a     comprehensive MRSA colonization     surveillance program. It is not     intended to diagnose MRSA     infection nor to guide or     monitor treatment for     MRSA infections.  CULTURE, BLOOD (ROUTINE X 2)     Status: None   Collection Time    01/09/14 12:00 PM      Result Value Ref Range Status   Specimen Description BLOOD RIGHT HAND   Final   Special Requests BOTTLES DRAWN AEROBIC AND ANAEROBIC 10CC   Final   Culture  Setup Time     Final   Value: 01/09/2014 19:21     Performed at [facility]   Culture     Final   Value: NO GROWTH 5 DAYS     Performed at [facility]   Report Status 01/29/2014 FINAL   Final  CULTURE, BLOOD (ROUTINE X 2)     Status: None   Collection Time    01/09/14 12:10 PM      Result Value Ref Range Status   Specimen Description BLOOD RIGHT ARM   Final  Special Requests BOTTLES DRAWN AEROBIC ONLY 10CC   Final   Culture  Setup Time     Final   Value: 01/09/2014 19:21     Performed at [facility]   Culture     Final   Value: NO GROWTH 5 DAYS     Performed at [facility]   Report Status February 14, 2014 FINAL   Final  AFB CULTURE WITH SMEAR     Status: None   Collection Time    01/11/14  2:53 PM      Result Value Ref Range Status   Specimen Description FLUID RIGHT PLEURAL    Final   Special Requests Normal   Final   ACID FAST SMEAR     Final   Value: NO ACID FAST BACILLI SEEN     Performed at [facility]   Culture     Final   Value: CULTURE WILL BE EXAMINED FOR 6 WEEKS BEFORE ISSUING A FINAL REPORT     Performed at [facility]   Report Status PENDING   Incomplete  BODY FLUID CULTURE     Status: None   Collection Time    01/11/14  2:53 PM      Result Value Ref Range Status   Specimen Description FLUID RIGHT PLEURAL   Final   Special Requests Normal   Final   Gram Stain     Final   Value: RARE WBC PRESENT,BOTH PMN AND MONONUCLEAR     NO ORGANISMS SEEN     Performed at [facility]   Culture     Final   Value: NO GROWTH 3 DAYS     Performed at [facility]   Report Status 02/14/14 FINAL   Final  FUNGUS CULTURE W SMEAR     Status: None   Collection Time    01/11/14  2:53 PM      Result Value Ref Range Status   Specimen Description FLUID RIGHT PLEURAL   Final   Special Requests Normal   Final   Fungal Smear     Final   Value: NO YEAST OR FUNGAL ELEMENTS SEEN     Performed at [facility]   Culture     Final   Value: CULTURE IN PROGRESS FOR FOUR WEEKS     Performed at [facility]   Report Status PENDING   Incomplete  MRSA PCR SCREENING     Status: None   Collection Time    01/12/14  3:36 PM      Result Value Ref Range Status   MRSA by PCR NEGATIVE  NEGATIVE Final   Comment:            The GeneXpert MRSA Assay (FDA     approved for NASAL specimens     only), is one component of a     comprehensive MRSA colonization     surveillance program. It is not     intended to diagnose MRSA     infection nor to guide or     monitor treatment for     MRSA infections.    Radiology Reports Ct Abdomen Pelvis Wo Contrast  01/07/2014   CLINICAL DATA:  Constipation. Hepatitis B. 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  EXAM: CT ABDOMEN AND PELVIS WITHOUT CONTRAST  TECHNIQUE: Multidetector CT imaging of the  abdomen and pelvis was performed following the standard protocol without intravenous contrast.  COMPARISON:  DG ABD ACUTE W/CHEST dated 01/29/2014  FINDINGS:  Unchanged chest findings of mediastinal adenopathy, bilateral pleural effusions, and left lower lobe mass. Hepatic steatosis. Normal spleen, gallbladder, pancreas, and kidneys.  8 x 16 mm left adrenal nodularity, nonspecific, but changed from 2007, not visibly hypervascular, metastasis versus small adenoma (favored) are considerations.  Normal kidneys ureters, and bladder. No retroperitoneal adenopathy. No bowel obstruction or bowel wall thickening. Moderate gastric distention of a gaseous nature, but no visible outlet obstruction. Moderate stool burden. No osseous findings. Chronic bone island in T12. Superficial soft tissues unremarkable. No pathologically enlarged inguinal adenopathy or hernia. No appendiceal inflammation. Mild prostate enlargement.  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  8 x 16 mm left adrenal nodularity, nonspecific, but interval change since 2007.  No acute intra-abdominal findings.   Electronically Signed   By: Rolla Flatten M.D.   On: 01/27/2014 20:23   Chest 2 View  01/06/2014   CLINICAL DATA:  Lung mass.  Preop.  EXAM: CHEST  2 VIEW  COMPARISON:  12/29/2013  FINDINGS: Unchanged appearance of left infrahilar mass with bilateral mediastinal lymphadenopathy. There are small bilateral pleural effusions, also seen previously. No edema or consolidation. Normal heart size. No acute osseous findings.  IMPRESSION: 1. Likely malignant left lung mass and lymphadenopathy. 2. Trace bilateral pleural effusions, unchanged from 12/29/2013.   Electronically Signed   By: Jorje Guild M.D.   On: 01/06/2014 07:16   Dg Chest 2 View  12/29/2013   CLINICAL DATA:  Cough, congestion, shortness of breath for 2 weeks, evaluate for pneumonia  EXAM: CHEST  2 VIEW  COMPARISON:  DG CHEST 1V PORT  dated 09/03/2013; DG CHEST 2 VIEW dated 06/05/2012; DG CHEST 2 VIEW dated 06/07/2011  FINDINGS: Grossly unchanged borderline enlarged cardiac silhouette. Interval development of an approximately 4.9 x 4.2 cm apparent mass within the superior segment of the left lower lobe, best appreciated on the provided lateral radiograph. This finding is associated with apparent nodular thickening of the contralateral right paratracheal stripe. The lungs remain hyperexpanded with flattening of bilateral hemidiaphragms and mild diffuse slightly nodular thickening of the pulmonary interstitium. Interval development of trace bilateral pleural effusions. Worsening left basilar heterogeneous/consolidative opacities. No evidence of edema. No pneumothorax. Grossly unchanged bones.  IMPRESSION: 1. Interval development of an approximately 4.9 cm mass within the superior segment left lower lobe with possible adenopathy within in the contralateral right mediastinum. Further evaluation with contrast-enhanced chest CT is recommended. 2. Worsening left basilar/retrocardiac opacities, atelectasis versus infiltrate. 3. Interval development of trace bilateral pleural effusions. No evidence of edema. These results will be called to the ordering clinician or representative by the Radiologist Assistant, and communication documented in the PACS Dashboard.   Electronically Signed   By: Sandi Mariscal M.D.   On: 12/29/2013 16:56   Ct Chest W Contrast  12/31/2013   CLINICAL DATA:  Abnormal chest radiograph with possible adenopathy, chest pain, shortness of breath.  EXAM: CT CHEST WITH CONTRAST  TECHNIQUE: Multidetector CT imaging of the chest was performed during intravenous contrast administration.  CONTRAST:  53mL OMNIPAQUE IOHEXOL 300 MG/ML  SOLN  COMPARISON:  Chest radiographs dated 12/29/2013.  FINDINGS: 5.0 x 4.4 cm central left lower lobe mass (series 3/image 39), suspicious for primary bronchogenic neoplasm. Mass extends into the left hilar region  (series 2/ image 34). Narrowing of the left lower lobe bronchus.  Small right and trace left pleural effusions.  No pneumothorax.  Visualized thyroid is unremarkable.  The heart is normal in size. No pericardial effusion.  Mild coronary atherosclerosis in the LAD. No evidence of thoracic aortic aneurysm.  Associated mediastinal lymphadenopathy, including:  --8 mm short axis right supraclavicular node (series 2/image 9)  --20 mm short axis high right paratracheal node (series 2/image 21)  --13 mm short axis right hilar node (series 2/image 32)  --31 mm short axis subcarinal node (series 2/image 34)  Visualized upper abdomen is unremarkable. Bilateral adrenal glands are within normal limits.  Degenerative changes of the visualized thoracolumbar spine. Probable bone island at T12 (sagittal image 79).  IMPRESSION: 5.0 cm central left lower lobe mass, suspicious for primary bronchogenic neoplasm.  Associated thoracic lymphadenopathy, as described above, including a prominent 8 mm short axis right supraclavicular node.  Small right and trace left pleural effusions.  For tissue diagnosis, consider bronchoscopy or percutaneous sampling of the right supraclavicular node.  These results will be called to the ordering clinician or representative by the Radiologist Assistant, and communication documented in the PACS Dashboard.   Electronically Signed   By: Julian Hy M.D.   On: 12/31/2013 14:36   Ct Angio Chest Pe W/cm &/or Wo Cm  01/30/2014   CLINICAL DATA:  Left lower lobe mass. Status post bronchoscopy and biopsy 2 days ago. Worsening shortness of breath. Hemoptysis.  EXAM: CT ANGIOGRAPHY CHEST WITH CONTRAST  TECHNIQUE: Multidetector CT imaging of the chest was performed using the standard protocol during bolus administration of intravenous contrast. Multiplanar CT image reconstructions and MIPs were obtained to evaluate the vascular anatomy.  CONTRAST:  70 mL OMNIPAQUE IOHEXOL 350 MG/ML SOLN  COMPARISON:  CT chest  12/31/2013. Chest in two views abdomen earlier this same day.  FINDINGS: There is a moderate right and small left pleural effusion. No pericardial effusion is identified. No pulmonary embolus is identified. The descending left interlobar and ascending right interlobar pulmonary arteries are narrowed by the patient's left lower lobe mass and mediastinal adenopathy.  Bulky mediastinal lymphadenopathy is unchanged. Again seen is a 2.0 cm right paratracheal node on image 28, a 3.1 cm subcarinal node on image 47 and a 2.7 cm left subcarinal lymph node on image 52. 5 cm in diameter left lower lobe mass is again identified. Increased compressive atelectasis is seen in the bases. There is a new nodular opacity in the right upper lobe measuring 1.1 x 0.8 cm on image 27. Incidentally imaged upper abdomen is unremarkable. No focal bony abnormality is identified.  Review of the MIP images confirms the above findings.  IMPRESSION: Negative for pulmonary embolus. Both the descending left interlobar and ascending right interlobar pulmonary arteries are compressed by lymphadenopathy and the left lower lobe mass.  No change in bulky mediastinal lymphadenopathy and a left lower lobe pulmonary mass.  Some increase in a moderate right and small left pleural effusion.  1.1 cm nodular opacity right upper lobe is new since CT scan 2 weeks ago and indeterminate. Recommend attention on follow-up exams.   Electronically Signed   By: Inge Rise M.D.   On: 02/02/2014 14:33   Mr Jeri Cos [id number] Contrast  01/10/2014   CLINICAL DATA:  Metastatic melanoma.  EXAM: MRI HEAD WITHOUT AND WITH CONTRAST  TECHNIQUE: Multiplanar, multiecho pulse sequences of the brain and surrounding structures were obtained without and with intravenous contrast.  CONTRAST:  14mL MULTIHANCE GADOBENATE DIMEGLUMINE 529 MG/ML IV SOLN  COMPARISON:  Head CT 05/12/2005  FINDINGS: There is no acute infarct. There is no evidence of intracranial hemorrhage, mass, midline shift,  or extra-axial fluid collection. There is no  abnormal enhancement. There is mild generalized cerebral atrophy. Small, scattered foci of T2 hyperintensity are present in the deep cerebral white matter bilaterally, nonspecific but compatible with mild chronic small vessel ischemic disease.  Orbits are unremarkable. Paranasal sinuses and mastoid air cells are clear. Major intracranial vascular flow voids are preserved. There is heterogeneous marrow signal at C2 involving the dens.  IMPRESSION: 1. No evidence of intracranial metastatic disease or acute intracranial abnormality. 2. Heterogeneous marrow signal in the dens, possibly degenerative although metastatic disease cannot be completely excluded. 3. Mild chronic small vessel ischemic disease.   Electronically Signed   By: Logan Bores   On: 01/10/2014 11:57   Dg Chest Port 1 View  01/12/2014   CLINICAL DATA:  Respiratory distress.  EXAM: PORTABLE CHEST - 1 VIEW  COMPARISON:  DG CHEST 1V PORT dated 01/11/2014; CT ANGIO CHEST W/CM &/OR WO/CM dated 01/10/2014  FINDINGS: Cardiac silhouette appears mildly enlarged, similar prominent mediastinal silhouette. Slightly decreased central pulmonary vasculature congestion with persistent left lung base airspace opacity and small left pleural effusion. No pneumothorax.  Multiple EKG lines overlie the patient and may obscure subtle underlying pathology. Mild degenerative change of the thoracic spine. Gaseous distended stomach.  IMPRESSION: Mild cardiomegaly, slightly decreased central pulmonary vasculature congestion with persistent left lung base airspace opacity and small pleural effusion.  Mildly prominent mediastinal silhouette corresponding to known lymphadenopathy.   Electronically Signed   By: Elon Alas   On: 01/12/2014 01:28   Dg Chest Port 1 View  01/11/2014   CLINICAL DATA:  Post thoracentesis  EXAM: PORTABLE CHEST - 1 VIEW  COMPARISON:  Portable exam [id number] hr compared to 01/10/2014 ; correlation CT chest  01/28/2014  FINDINGS: Normal heart size and pulmonary vascularity.  Prominent right peritracheal soft tissues and prominent left hilum corresponding to adenopathy on prior CT. Atelectasis versus consolidation left lower lobe.  Minimal right basilar atelectasis.  Persistent left pleural effusion.  No pneumothorax.  IMPRESSION: No pneumothorax following thoracentesis.  Persistent left pleural effusion and left lower lobe atelectasis versus consolidation.  Thoracic adenopathy.   Electronically Signed   By: Lavonia Dana M.D.   On: 01/11/2014 17:10   Dg Chest Port 1 View  01/10/2014   CLINICAL DATA:  Assess infiltrates.  EXAM: PORTABLE CHEST - 1 VIEW  COMPARISON:  DG CHEST 1V PORT dated 01/09/2014; CT ANGIO CHEST W/CM &/OR WO/CM dated 01/31/2014  FINDINGS: Stable enlargement of the mediastinum is consistent with lymphadenopathy. Increased densities in the left lung could represent edema or layering pleural fluid. Persistent densities in the medial right lung base. Stable consolidation in the retrocardiac space.  IMPRESSION: Increased densities in the left lung could represent edema or layering fluid.  Persistent consolidation in the retrocardiac space.  Mediastinal lymphadenopathy.   Electronically Signed   By: Markus Daft M.D.   On: 01/10/2014 08:27   Dg Chest Port 1 View  01/09/2014   CLINICAL DATA:  Shortness of breath.  EXAM: PORTABLE CHEST - 1 VIEW  COMPARISON:  01/16/2014  FINDINGS: Chest radiograph demonstrates persistent densities in the medial left lower lung. Findings are compatible with volume loss and consolidation. Heart size is grossly stable. The trachea is midline. Again noted is widening of the mediastinum related to the known lymphadenopathy.  IMPRESSION: Persistent consolidation in the medial left lower lung.  Mediastinal lymphadenopathy.   Electronically Signed   By: Markus Daft M.D.   On: 01/09/2014 14:35   Dg Abd Acute W/chest  01/07/2014   CLINICAL DATA:  Shortness of breath.  Abdominal pain and  distention.  EXAM: ACUTE ABDOMEN SERIES (ABDOMEN 2 VIEW & CHEST 1 VIEW)  COMPARISON:  CT chest 12/31/2013 and PA and lateral chest 01/06/2014.  FINDINGS: Single view of the chest demonstrates bulky mediastinal lymphadenopathy. Left lower lobe mass is seen as on the prior study. There are small bilateral pleural effusions. No pneumothorax.  Two views of the abdomen show no free intraperitoneal air. The stomach appears distended. There is no evidence of small bowel obstruction. Large volume of stool throughout the colon is noted.  IMPRESSION: Left lower lobe mass and mediastinal lymphadenopathy as seen on prior CT. Small bilateral pleural effusions are identified.  Negative for free intraperitoneal air or bowel obstruction.  Gaseous distention of the stomach.  Large volume of stool throughout the colon.   Electronically Signed   By: Inge Rise M.D.   On: 01/25/2014 13:51    CBC  Recent Labs Lab 01/10/14 1923 01/11/14 0350 01/12/14 0616  WBC 19.2* 19.9* 18.0*  HGB 10.9* 10.9* 11.1*  HCT 30.7* 31.1* 31.7*  PLT 111* 104* 103*  MCV 86.5 86.6 87.3  MCH 30.7 30.4 30.6  MCHC 35.5 35.0 35.0  RDW 12.9 12.8 13.1    Chemistries   Recent Labs Lab 01/10/14 0343 01/11/14 0350 01/12/14 0616  NA 136* 139 139  K 4.1 4.3 4.5  CL 94* 98 98  CO2 17* 20 23  GLUCOSE 213* 178* 137*  BUN 39* 48* 50*  CREATININE 1.43* 1.29 1.26  CALCIUM 10.0 9.9 9.7  AST 46* 39*  --   ALT 24 23  --   ALKPHOS 98 90  --   BILITOT 0.4 0.3  --    ------------------------------------------------------------------------------------------------------------------ estimated creatinine clearance is 57.1 ml/min (by C-G formula based on Cr of 1.26). ------------------------------------------------------------------------------------------------------------------ No results found for this basename: HGBA1C,  in the last 72  hours ------------------------------------------------------------------------------------------------------------------ No results found for this basename: CHOL, HDL, LDLCALC, TRIG, CHOLHDL, LDLDIRECT,  in the last 72 hours ------------------------------------------------------------------------------------------------------------------ No results found for this basename: TSH, T4TOTAL, FREET3, T3FREE, THYROIDAB,  in the last 72 hours ------------------------------------------------------------------------------------------------------------------ No results found for this basename: VITAMINB12, FOLATE, FERRITIN, TIBC, IRON, RETICCTPCT,  in the last 72 hours  Coagulation profile No results found for this basename: INR, PROTIME,  in the last 168 hours  No results found for this basename: DDIMER,  in the last 72 hours  Cardiac Enzymes No results found for this basename: CK, CKMB, TROPONINI, MYOGLOBIN,  in the last 168 hours ------------------------------------------------------------------------------------------------------------------ No components found with this basename: POCBNP,      Time Spent in minutes   35   Lala Lund K M.D on 01/16/2014 at 5:58 PM  Between 7am to 7pm - Pager - [phone]  After 7pm go to [email or web address] - password [email or web address]  And look for the night coverage person covering for me after hours  [facility] Office  [phone]

## 2014-01-18 ENCOUNTER — Encounter (HOSPITAL_COMMUNITY): Payer: Self-pay

## 2014-01-18 ENCOUNTER — Ambulatory Visit (HOSPITAL_COMMUNITY): Payer: BC Managed Care – PPO

## 2014-01-18 LAB — RHEUMATOID FACTORS, FLUID: CORTISOL #1 (BASE): NEGATIVE

## 2014-01-19 ENCOUNTER — Encounter: Payer: Self-pay | Admitting: Medical Oncology

## 2014-01-19 NOTE — Progress Notes (Signed)
BRAF mutation detected left lower lobe tissue.

## 2014-02-03 NOTE — Progress Notes (Signed)
Nutrition Brief Note  Patient identified on [facility] [place] report Chart reviewed. Pt now transitioning to comfort care.  No further nutrition interventions warranted at this time.  Please re-consult as needed.    Atlee Abide MS RD LDN Clinical Dietitian [phone]

## 2014-02-03 NOTE — Progress Notes (Cosign Needed)
Called to patients room by family. Patient's respirations very slowed. I observed immediate change in color to  Pale/dusky and no respirations observed, no heart beat ausculated at 2040. Family at bedside. T.Callahan PA-C notified. Prouncement of death assessed by second  by Dondra Prader, RN.Harlene Ramus

## 2014-02-03 NOTE — Progress Notes (Signed)
Patient Demographics  Andrew Church, is a 68 y.o. male, DOB - 1946-03-15, XVQ:[id number]  Admit date - 01/28/2014   Admitting Physician Belkys A Harrel Carina, MD  Outpatient Primary MD for the patient is Simona Huh, MD  LOS - 7   Chief Complaint  Patient presents with  . 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 Patient was transferred to [facility] for oncology input and radiation oncology input, he was seen by oncologist Dr.Magrinat and after detailed discussion with family, patient and palliative care it was decided that patient will be best served with full comfort care which was started on 01/12/2014. He should today appears to be unresponsive but comfortable, family by bedside, they agree with the plan a full comfort care, he is currently on IV morphine and IV Ativan. All other medications have been stopped except medications for comfort. Goal of care is full comfort care. Expect death soon.    For other issues addressed earlier during this admission see the progress note from the hospitalist service on 01/12/2014     Code Status: DNR  Family Communication: Wife and daughter  Disposition Plan: Insurance declined GIP   Procedures     Consults Pall care   Medications  Scheduled Meds: . diltiazem  120 mg Oral Daily  . furosemide  20 mg Intravenous BID  .  levalbuterol  1.25 mg Nebulization Q6H  . pantoprazole (PROTONIX) IV  40 mg Intravenous QHS  . scopolamine  1 patch Transdermal Q72H  . sodium chloride  3 mL Intravenous Q12H   Continuous Infusions: . morphine 2 mg/hr (01/14/14 1713)   PRN Meds:.sodium chloride, atropine, glycopyrrolate, hydrALAZINE, LORazepam, morphine injection, ondansetron (ZOFRAN) IV, ondansetron, sodium chloride, sodium chloride  DVT Prophylaxis  SCDs   Lab Results  Component Value Date   PLT 103* 01/12/2014    Antibiotics   Anti-infectives   Start     Dose/Rate Route Frequency Ordered Stop   01/09/14 1400  piperacillin-tazobactam (ZOSYN) IVPB 3.375 g  Status:  Discontinued     3.375 g 12.5 mL/hr over 240 Minutes Intravenous 3 times per day 01/09/14 1241 01/13/14 1029          Subjective:    Andrew Church today is resting in bed, he is unresponsive but appears to be in no distress. Taking rapid deep breaths, coarse breath sounds.    Objective:   Filed Vitals:   01/14/14 1558 01/14/14 2017 01/11/2014 0210 01/11/2014 0502  BP:    121/66  Pulse:    108  Temp:    99.4 F (37.4 C)  TempSrc:    Axillary  Resp:    16  Height:      Weight:      SpO2: 95% 94% 93% 93%    Wt Readings from Last 3 Encounters:  01/14/2014 86.2 kg (190 lb 0.6 oz)  01/05/14 86.183 kg (  190 lb)  11/23/13 86.41 kg (190 lb 8 oz)     Intake/Output Summary (Last 24 hours) at 01/19/2014 0843 Last data filed at 01/25/2014 0502  Gross per 24 hour  Intake    228 ml  Output    750 ml  Net   -522 ml     Physical Exam   Lying in bed unresponsive taking deep breaths and does not appear to be in discomfort [place].AT,PERRAL Supple Neck,No JVD, No cervical lymphadenopathy appriciated.  Symmetrical Chest wall movement, Good air movement bilaterally, coarse bilateral breath sounds with rales RRR,No Gallops,Rubs or new Murmurs, No Parasternal Heave +ve B.Sounds, Abd Soft, Non tender, No organomegaly appriciated, No rebound - guarding or  rigidity. No Cyanosis, Clubbing or edema, No new Rash or bruise     Data Review   Micro Results Recent Results (from the past 240 hour(s))  MRSA PCR SCREENING     Status: None   Collection Time    01/27/2014  8:50 PM      Result Value Ref Range Status   MRSA by PCR NEGATIVE  NEGATIVE Final   Comment:            The GeneXpert MRSA Assay (FDA     approved for NASAL specimens     only), is one component of a     comprehensive MRSA colonization     surveillance program. It is not     intended to diagnose MRSA     infection nor to guide or     monitor treatment for     MRSA infections.  CULTURE, BLOOD (ROUTINE X 2)     Status: None   Collection Time    01/09/14 12:00 PM      Result Value Ref Range Status   Specimen Description BLOOD RIGHT HAND   Final   Special Requests BOTTLES DRAWN AEROBIC AND ANAEROBIC 10CC   Final   Culture  Setup Time     Final   Value: 01/09/2014 19:21     Performed at [facility]   Culture     Final   Value:        BLOOD CULTURE RECEIVED NO GROWTH TO DATE CULTURE WILL BE HELD FOR 5 DAYS BEFORE ISSUING A FINAL NEGATIVE REPORT     Performed at [facility]   Report Status PENDING   Incomplete  CULTURE, BLOOD (ROUTINE X 2)     Status: None   Collection Time    01/09/14 12:10 PM      Result Value Ref Range Status   Specimen Description BLOOD RIGHT ARM   Final   Special Requests BOTTLES DRAWN AEROBIC ONLY 10CC   Final   Culture  Setup Time     Final   Value: 01/09/2014 19:21     Performed at [facility]   Culture     Final   Value:        BLOOD CULTURE RECEIVED NO GROWTH TO DATE CULTURE WILL BE HELD FOR 5 DAYS BEFORE ISSUING A FINAL NEGATIVE REPORT     Performed at [facility]   Report Status PENDING   Incomplete  AFB CULTURE WITH SMEAR     Status: None   Collection Time    01/11/14  2:53 PM      Result Value Ref Range Status   Specimen Description FLUID RIGHT PLEURAL   Final   Special Requests Normal   Final    ACID FAST SMEAR  Final   Value: NO ACID FAST BACILLI SEEN     Performed at [facility]   Culture     Final   Value: CULTURE WILL BE EXAMINED FOR 6 WEEKS BEFORE ISSUING A FINAL REPORT     Performed at [facility]   Report Status PENDING   Incomplete  BODY FLUID CULTURE     Status: None   Collection Time    01/11/14  2:53 PM      Result Value Ref Range Status   Specimen Description FLUID RIGHT PLEURAL   Final   Special Requests Normal   Final   Gram Stain     Final   Value: RARE WBC PRESENT,BOTH PMN AND MONONUCLEAR     NO ORGANISMS SEEN     Performed at [facility]   Culture     Final   Value: NO GROWTH 2 DAYS     Performed at [facility]   Report Status PENDING   Incomplete  FUNGUS CULTURE W SMEAR     Status: None   Collection Time    01/11/14  2:53 PM      Result Value Ref Range Status   Specimen Description FLUID RIGHT PLEURAL   Final   Special Requests Normal   Final   Fungal Smear     Final   Value: NO YEAST OR FUNGAL ELEMENTS SEEN     Performed at [facility]   Culture     Final   Value: CULTURE IN PROGRESS FOR FOUR WEEKS     Performed at [facility]   Report Status PENDING   Incomplete  MRSA PCR SCREENING     Status: None   Collection Time    01/12/14  3:36 PM      Result Value Ref Range Status   MRSA by PCR NEGATIVE  NEGATIVE Final   Comment:            The GeneXpert MRSA Assay (FDA     approved for NASAL specimens     only), is one component of a     comprehensive MRSA colonization     surveillance program. It is not     intended to diagnose MRSA     infection nor to guide or     monitor treatment for     MRSA infections.    Radiology Reports Ct Abdomen Pelvis Wo Contrast  01/07/2014   CLINICAL DATA:  Constipation. Hepatitis B. 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  EXAM: CT ABDOMEN AND PELVIS WITHOUT CONTRAST  TECHNIQUE: Multidetector CT imaging of the abdomen and pelvis was performed following the  standard protocol without intravenous contrast.  COMPARISON:  DG ABD ACUTE W/CHEST dated 01/22/2014  FINDINGS: Unchanged chest findings of mediastinal adenopathy, bilateral pleural effusions, and left lower lobe mass. Hepatic steatosis. Normal spleen, gallbladder, pancreas, and kidneys.  8 x 16 mm left adrenal nodularity, nonspecific, but changed from 2007, not visibly hypervascular, metastasis versus small adenoma (favored) are considerations.  Normal kidneys ureters, and bladder. No retroperitoneal adenopathy. No bowel obstruction or bowel wall thickening. Moderate gastric distention of a gaseous nature, but no visible outlet obstruction. Moderate stool burden. No osseous findings. Chronic bone island in T12. Superficial soft tissues unremarkable. No pathologically enlarged inguinal adenopathy or hernia. No appendiceal inflammation. Mild prostate enlargement.  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  8 x 16 mm left adrenal nodularity, nonspecific, but interval change since 2007.  No acute intra-abdominal findings.   Electronically Signed   By: Rolla Flatten M.D.   On: 01/14/2014 20:23   Chest 2 View  01/06/2014   CLINICAL DATA:  Lung mass.  Preop.  EXAM: CHEST  2 VIEW  COMPARISON:  12/29/2013  FINDINGS: Unchanged appearance of left infrahilar mass with bilateral mediastinal lymphadenopathy. There are small bilateral pleural effusions, also seen previously. No edema or consolidation. Normal heart size. No acute osseous findings.  IMPRESSION: 1. Likely malignant left lung mass and lymphadenopathy. 2. Trace bilateral pleural effusions, unchanged from 12/29/2013.   Electronically Signed   By: Jorje Guild M.D.   On: 01/06/2014 07:16   Dg Chest 2 View  12/29/2013   CLINICAL DATA:  Cough, congestion, shortness of breath for 2 weeks, evaluate for pneumonia  EXAM: CHEST  2 VIEW  COMPARISON:  DG CHEST 1V PORT dated 09/03/2013; DG CHEST 2 VIEW dated  06/05/2012; DG CHEST 2 VIEW dated 06/07/2011  FINDINGS: Grossly unchanged borderline enlarged cardiac silhouette. Interval development of an approximately 4.9 x 4.2 cm apparent mass within the superior segment of the left lower lobe, best appreciated on the provided lateral radiograph. This finding is associated with apparent nodular thickening of the contralateral right paratracheal stripe. The lungs remain hyperexpanded with flattening of bilateral hemidiaphragms and mild diffuse slightly nodular thickening of the pulmonary interstitium. Interval development of trace bilateral pleural effusions. Worsening left basilar heterogeneous/consolidative opacities. No evidence of edema. No pneumothorax. Grossly unchanged bones.  IMPRESSION: 1. Interval development of an approximately 4.9 cm mass within the superior segment left lower lobe with possible adenopathy within in the contralateral right mediastinum. Further evaluation with contrast-enhanced chest CT is recommended. 2. Worsening left basilar/retrocardiac opacities, atelectasis versus infiltrate. 3. Interval development of trace bilateral pleural effusions. No evidence of edema. These results will be called to the ordering clinician or representative by the Radiologist Assistant, and communication documented in the PACS Dashboard.   Electronically Signed   By: Sandi Mariscal M.D.   On: 12/29/2013 16:56   Ct Chest W Contrast  12/31/2013   CLINICAL DATA:  Abnormal chest radiograph with possible adenopathy, chest pain, shortness of breath.  EXAM: CT CHEST WITH CONTRAST  TECHNIQUE: Multidetector CT imaging of the chest was performed during intravenous contrast administration.  CONTRAST:  80mL OMNIPAQUE IOHEXOL 300 MG/ML  SOLN  COMPARISON:  Chest radiographs dated 12/29/2013.  FINDINGS: 5.0 x 4.4 cm central left lower lobe mass (series 3/image 39), suspicious for primary bronchogenic neoplasm. Mass extends into the left hilar region (series 2/ image 34). Narrowing of the  left lower lobe bronchus.  Small right and trace left pleural effusions.  No pneumothorax.  Visualized thyroid is unremarkable.  The heart is normal in size. No pericardial effusion. Mild coronary atherosclerosis in the LAD. No evidence of thoracic aortic aneurysm.  Associated mediastinal lymphadenopathy, including:  --8 mm short axis right supraclavicular node (series 2/image 9)  --20 mm short axis high right paratracheal node (series 2/image 21)  --13 mm short axis right hilar node (series 2/image 32)  --31 mm short axis subcarinal node (series 2/image 34)  Visualized upper abdomen is unremarkable. Bilateral adrenal glands are within normal limits.  Degenerative changes of the visualized thoracolumbar spine. Probable bone island at T12 (sagittal image 79).  IMPRESSION: 5.0 cm central left lower lobe mass, suspicious for primary bronchogenic neoplasm.  Associated thoracic lymphadenopathy, as described above, including a prominent 8 mm short axis right supraclavicular node.  Small right and trace left pleural effusions.  For tissue diagnosis, consider bronchoscopy or percutaneous sampling of the right supraclavicular node.  These results will be called to the ordering clinician or representative by the Radiologist Assistant, and communication documented in the PACS Dashboard.   Electronically Signed   By: Julian Hy M.D.   On: 12/31/2013 14:36   Ct Angio Chest Pe W/cm &/or Wo Cm  01/09/2014   CLINICAL DATA:  Left lower lobe mass. Status post bronchoscopy and biopsy 2 days ago. Worsening shortness of breath. Hemoptysis.  EXAM: CT ANGIOGRAPHY CHEST WITH CONTRAST  TECHNIQUE: Multidetector CT imaging of the chest was performed using the standard protocol during bolus administration of intravenous contrast. Multiplanar CT image reconstructions and MIPs were obtained to evaluate the vascular anatomy.  CONTRAST:  70 mL OMNIPAQUE IOHEXOL 350 MG/ML SOLN  COMPARISON:  CT chest 12/31/2013. Chest in two views abdomen  earlier this same day.  FINDINGS: There is a moderate right and small left pleural effusion. No pericardial effusion is identified. No pulmonary embolus is identified. The descending left interlobar and ascending right interlobar pulmonary arteries are narrowed by the patient's left lower lobe mass and mediastinal adenopathy.  Bulky mediastinal lymphadenopathy is unchanged. Again seen is a 2.0 cm right paratracheal node on image 28, a 3.1 cm subcarinal node on image 47 and a 2.7 cm left subcarinal lymph node on image 52. 5 cm in diameter left lower lobe mass is again identified. Increased compressive atelectasis is seen in the bases. There is a new nodular opacity in the right upper lobe measuring 1.1 x 0.8 cm on image 27. Incidentally imaged upper abdomen is unremarkable. No focal bony abnormality is identified.  Review of the MIP images confirms the above findings.  IMPRESSION: Negative for pulmonary embolus. Both the descending left interlobar and ascending right interlobar pulmonary arteries are compressed by lymphadenopathy and the left lower lobe mass.  No change in bulky mediastinal lymphadenopathy and a left lower lobe pulmonary mass.  Some increase in a moderate right and small left pleural effusion.  1.1 cm nodular opacity right upper lobe is new since CT scan 2 weeks ago and indeterminate. Recommend attention on follow-up exams.   Electronically Signed   By: Inge Rise M.D.   On: 01/26/2014 14:33   Mr Jeri Cos [id number] Contrast  01/10/2014   CLINICAL DATA:  Metastatic melanoma.  EXAM: MRI HEAD WITHOUT AND WITH CONTRAST  TECHNIQUE: Multiplanar, multiecho pulse sequences of the brain and surrounding structures were obtained without and with intravenous contrast.  CONTRAST:  75mL MULTIHANCE GADOBENATE DIMEGLUMINE 529 MG/ML IV SOLN  COMPARISON:  Head CT 05/12/2005  FINDINGS: There is no acute infarct. There is no evidence of intracranial hemorrhage, mass, midline shift, or extra-axial fluid collection. There  is no abnormal enhancement. There is mild generalized cerebral atrophy. Small, scattered foci of T2 hyperintensity are present in the deep cerebral white matter bilaterally, nonspecific but compatible with mild chronic small vessel ischemic disease.  Orbits are unremarkable. Paranasal sinuses and mastoid air cells are clear. Major intracranial vascular flow voids are preserved. There is heterogeneous marrow signal at C2 involving the dens.  IMPRESSION: 1. No evidence of intracranial metastatic disease or acute intracranial abnormality. 2. Heterogeneous marrow signal in the dens, possibly degenerative although metastatic disease cannot be completely excluded. 3. Mild chronic small vessel ischemic disease.   Electronically Signed   By: Logan Bores   On: 01/10/2014 11:57   Dg Chest Port 1 View  01/12/2014   CLINICAL DATA:  Respiratory distress.  EXAM: PORTABLE CHEST - 1 VIEW  COMPARISON:  DG CHEST 1V PORT dated 01/11/2014; CT ANGIO CHEST W/CM &/OR WO/CM dated 01/04/2014  FINDINGS: Cardiac silhouette appears mildly enlarged, similar prominent mediastinal silhouette. Slightly decreased central pulmonary vasculature congestion with persistent left lung base airspace opacity and small left pleural effusion. No pneumothorax.  Multiple EKG lines overlie the patient and may obscure subtle underlying pathology. Mild degenerative change of the thoracic spine. Gaseous distended stomach.  IMPRESSION: Mild cardiomegaly, slightly decreased central pulmonary vasculature congestion with persistent left lung base airspace opacity and small pleural effusion.  Mildly prominent mediastinal silhouette corresponding to known lymphadenopathy.   Electronically Signed   By: Elon Alas   On: 01/12/2014 01:28   Dg Chest Port 1 View  01/11/2014   CLINICAL DATA:  Post thoracentesis  EXAM: PORTABLE CHEST - 1 VIEW  COMPARISON:  Portable exam [id number] hr compared to 01/10/2014 ; correlation CT chest 01/05/2014  FINDINGS: Normal heart size and  pulmonary vascularity.  Prominent right peritracheal soft tissues and prominent left hilum corresponding to adenopathy on prior CT. Atelectasis versus consolidation left lower lobe.  Minimal right basilar atelectasis.  Persistent left pleural effusion.  No pneumothorax.  IMPRESSION: No pneumothorax following thoracentesis.  Persistent left pleural effusion and left lower lobe atelectasis versus consolidation.  Thoracic adenopathy.   Electronically Signed   By: Lavonia Dana M.D.   On: 01/11/2014 17:10   Dg Chest Port 1 View  01/10/2014   CLINICAL DATA:  Assess infiltrates.  EXAM: PORTABLE CHEST - 1 VIEW  COMPARISON:  DG CHEST 1V PORT dated 01/09/2014; CT ANGIO CHEST W/CM &/OR WO/CM dated 01/26/2014  FINDINGS: Stable enlargement of the mediastinum is consistent with lymphadenopathy. Increased densities in the left lung could represent edema or layering pleural fluid. Persistent densities in the medial right lung base. Stable consolidation in the retrocardiac space.  IMPRESSION: Increased densities in the left lung could represent edema or layering fluid.  Persistent consolidation in the retrocardiac space.  Mediastinal lymphadenopathy.   Electronically Signed   By: Markus Daft M.D.   On: 01/10/2014 08:27   Dg Chest Port 1 View  01/09/2014   CLINICAL DATA:  Shortness of breath.  EXAM: PORTABLE CHEST - 1 VIEW  COMPARISON:  01/12/2014  FINDINGS: Chest radiograph demonstrates persistent densities in the medial left lower lung. Findings are compatible with volume loss and consolidation. Heart size is grossly stable. The trachea is midline. Again noted is widening of the mediastinum related to the known lymphadenopathy.  IMPRESSION: Persistent consolidation in the medial left lower lung.  Mediastinal lymphadenopathy.   Electronically Signed   By: Markus Daft M.D.   On: 01/09/2014 14:35   Dg Abd Acute W/chest  01/18/2014   CLINICAL DATA:  Shortness of breath.  Abdominal pain and distention.  EXAM: ACUTE ABDOMEN SERIES  (ABDOMEN 2 VIEW & CHEST 1 VIEW)  COMPARISON:  CT chest 12/31/2013 and PA and lateral chest 01/06/2014.  FINDINGS: Single view of the chest demonstrates bulky mediastinal lymphadenopathy. Left lower lobe mass is seen as on the prior study. There are small bilateral pleural effusions. No pneumothorax.  Two views of the abdomen show no free intraperitoneal air. The stomach appears distended. There is no evidence of small bowel obstruction. Large volume of stool throughout the colon is noted.  IMPRESSION: Left lower lobe mass and mediastinal lymphadenopathy as seen on prior CT. Small bilateral pleural effusions are identified.  Negative for free intraperitoneal air or bowel obstruction.  Gaseous distention of the  stomach.  Large volume of stool throughout the colon.   Electronically Signed   By: Inge Rise M.D.   On: 01/11/2014 13:51    CBC  Recent Labs Lab 01/10/2014 1150 01/09/14 0247 01/10/14 1923 01/11/14 0350 01/12/14 0616  WBC 13.1* 15.8* 19.2* 19.9* 18.0*  HGB 12.5* 11.7* 10.9* 10.9* 11.1*  HCT 34.5* 32.4* 30.7* 31.1* 31.7*  PLT 119* 110* 111* 104* 103*  MCV 86.5 86.2 86.5 86.6 87.3  MCH 31.3 31.1 30.7 30.4 30.6  MCHC 36.2* 36.1* 35.5 35.0 35.0  RDW 13.1 12.8 12.9 12.8 13.1    Chemistries   Recent Labs Lab 01/16/2014 1150 01/09/14 0247 01/10/14 0343 01/11/14 0350 01/12/14 0616  NA 141 137 136* 139 139  K 4.0 4.5 4.1 4.3 4.5  CL 102 96 94* 98 98  CO2 19 16* 17* 20 23  GLUCOSE 235* 274* 213* 178* 137*  BUN 31* 31* 39* 48* 50*  CREATININE 1.17 1.29 1.43* 1.29 1.26  CALCIUM 9.8 9.6 10.0 9.9 9.7  MG 2.1  --   --   --   --   AST  --   --  46* 39*  --   ALT  --   --  24 23  --   ALKPHOS  --   --  98 90  --   BILITOT  --   --  0.4 0.3  --    ------------------------------------------------------------------------------------------------------------------ estimated creatinine clearance is 57.1 ml/min (by C-G formula based on Cr of  1.26). ------------------------------------------------------------------------------------------------------------------ No results found for this basename: HGBA1C,  in the last 72 hours ------------------------------------------------------------------------------------------------------------------ No results found for this basename: CHOL, HDL, LDLCALC, TRIG, CHOLHDL, LDLDIRECT,  in the last 72 hours ------------------------------------------------------------------------------------------------------------------ No results found for this basename: TSH, T4TOTAL, FREET3, T3FREE, THYROIDAB,  in the last 72 hours ------------------------------------------------------------------------------------------------------------------ No results found for this basename: VITAMINB12, FOLATE, FERRITIN, TIBC, IRON, RETICCTPCT,  in the last 72 hours  Coagulation profile No results found for this basename: INR, PROTIME,  in the last 168 hours  No results found for this basename: DDIMER,  in the last 72 hours  Cardiac Enzymes  Recent Labs Lab 01/09/2014 1614 01/21/2014 2236 01/09/14 0247  TROPONINI <0.30 <0.30 <0.30   ------------------------------------------------------------------------------------------------------------------ No components found with this basename: POCBNP,      Time Spent in minutes   35   Lala Lund K M.D on Feb 08, 2014 at 8:43 AM  Between 7am to 7pm - Pager - [phone]  After 7pm go to [email or web address] - password [email or web address]  And look for the night coverage person covering for me after hours  [facility] Office  [phone]

## 2014-02-03 DEATH — deceased

## 2014-02-08 LAB — FUNGUS CULTURE W SMEAR
Fungal Smear: NONE SEEN
Special Requests: NORMAL

## 2014-02-10 ENCOUNTER — Ambulatory Visit: Payer: BC Managed Care – PPO | Admitting: Cardiology

## 2014-02-23 LAB — AFB CULTURE WITH SMEAR (NOT AT ARMC)
Acid Fast Smear: NONE SEEN
Special Requests: NORMAL

## 2014-11-19 IMAGING — CT CT ANGIO CHEST
2 of 8 series · 18 of 46 positions shown · IV contrast (APPLIED)
Comparison: CT chest 12/31/2013. Chest in two views abdomen earlier
this same day.

CLINICAL DATA: Left lower lobe mass. Status post bronchoscopy and
biopsy 2 days ago. Worsening shortness of breath. Hemoptysis.

EXAM:
CT ANGIOGRAPHY CHEST WITH CONTRAST
TECHNIQUE: Multidetector CT imaging of the chest was performed using the
standard protocol during bolus administration of intravenous
contrast. Multiplanar CT image reconstructions and MIPs were
obtained to evaluate the vascular anatomy.
CONTRAST:  70 mL OMNIPAQUE IOHEXOL 350 MG/ML SOLN

[Series 6: thins · axial · 0.71mm/px · z∈[-904,-665]mm · 15 of 269 slices shown]
[im 15/269  lung]
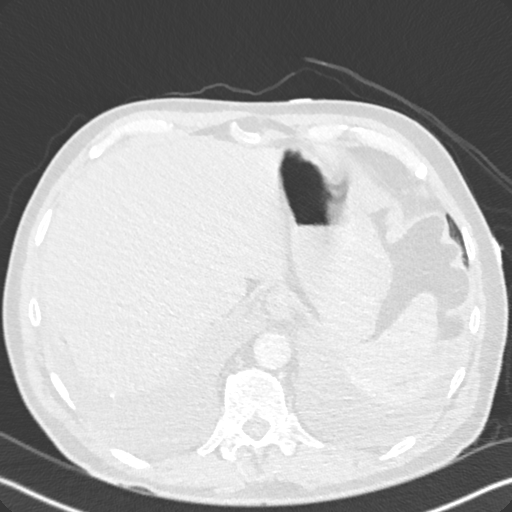
[im 30/269  soft-tissue]
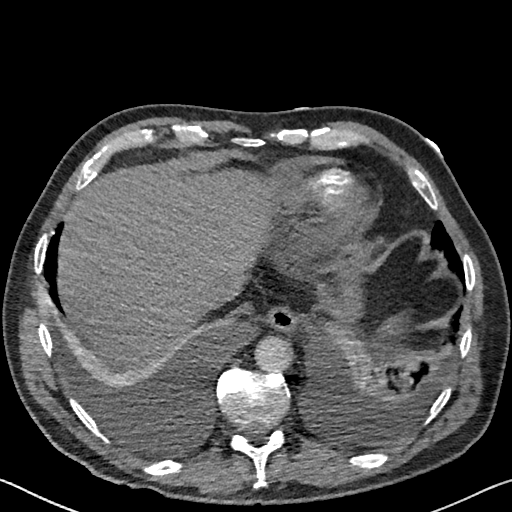
[im 45/269  lung]
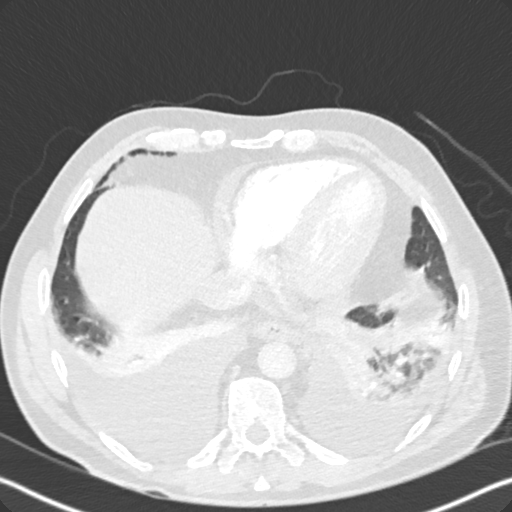
[im 60/269  soft-tissue]
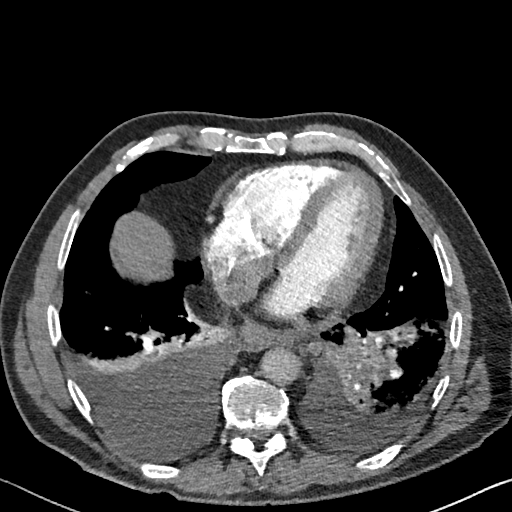
[im 90/269  lung]
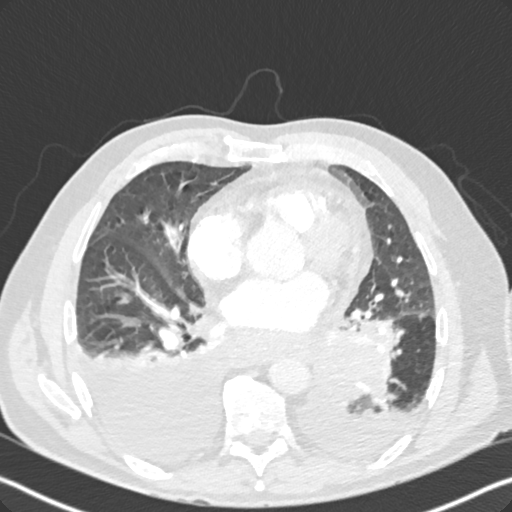
[im 105/269  soft-tissue]
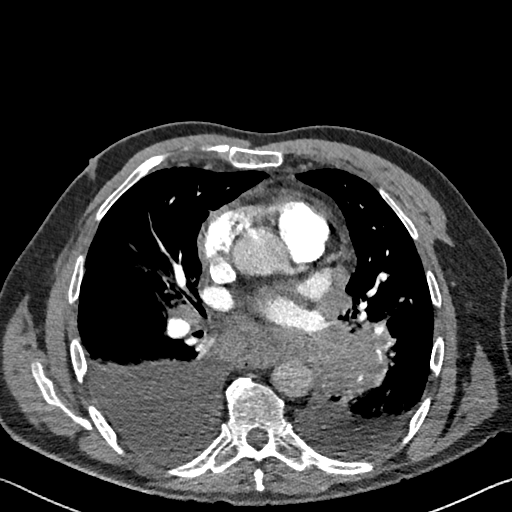
[im 120/269  lung]
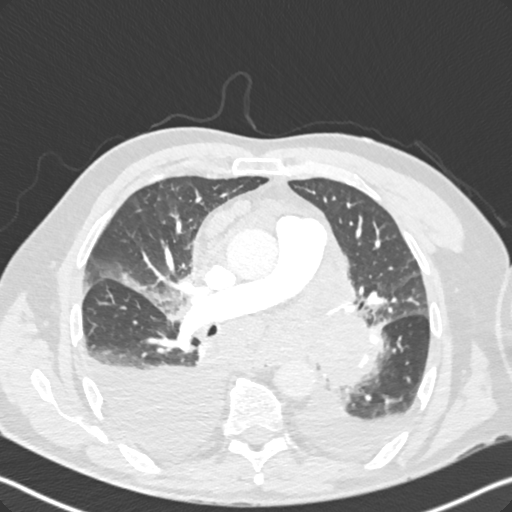
[im 135/269  soft-tissue]
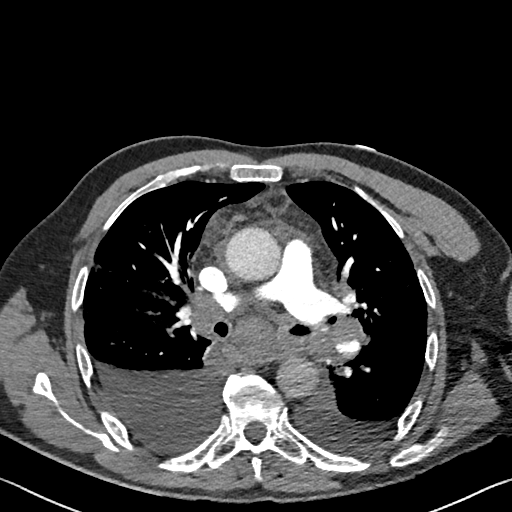
[im 149/269  lung]
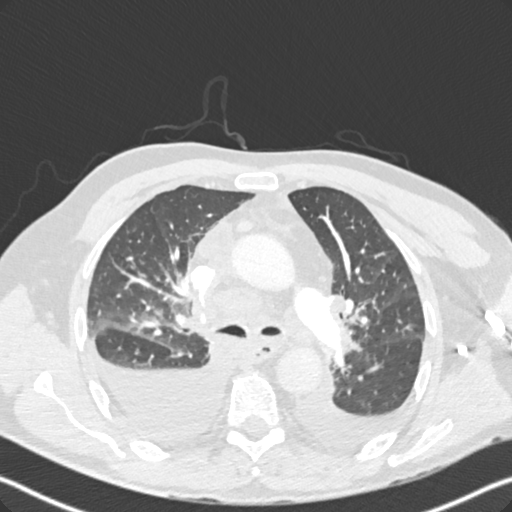
[im 164/269  soft-tissue]
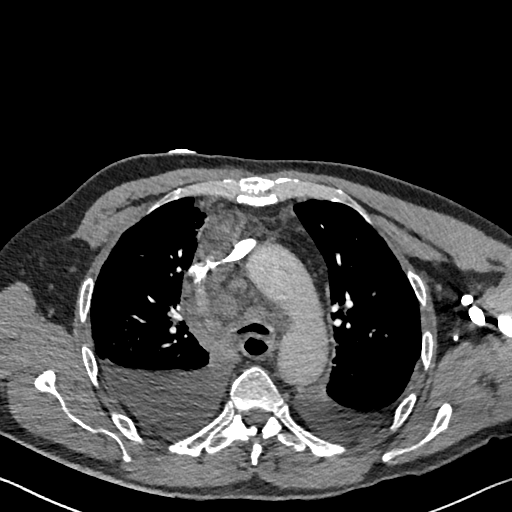
[im 179/269  lung]
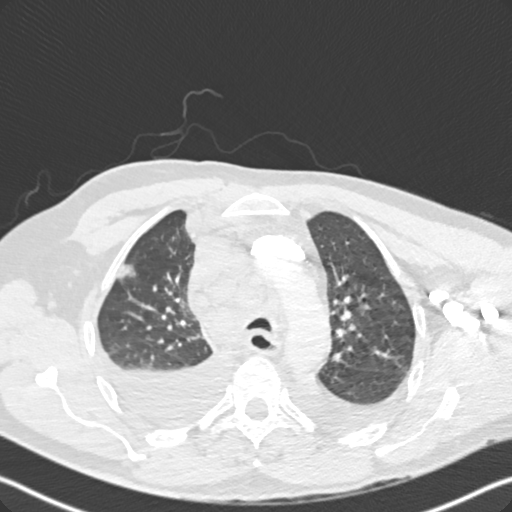
[im 209/269  soft-tissue]
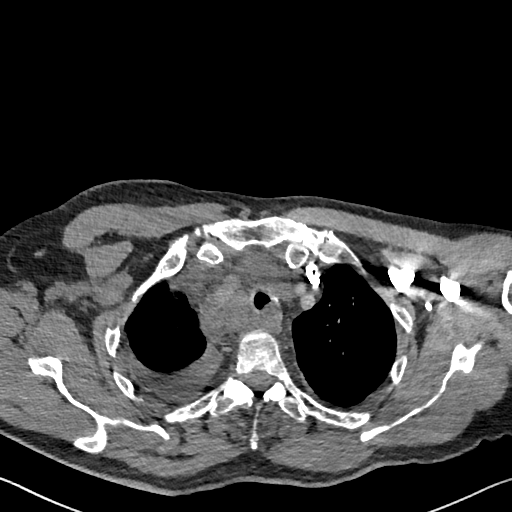
[im 224/269  lung]
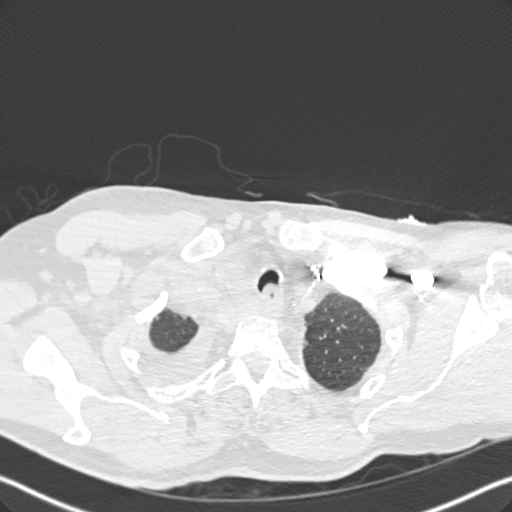
[im 239/269  soft-tissue]
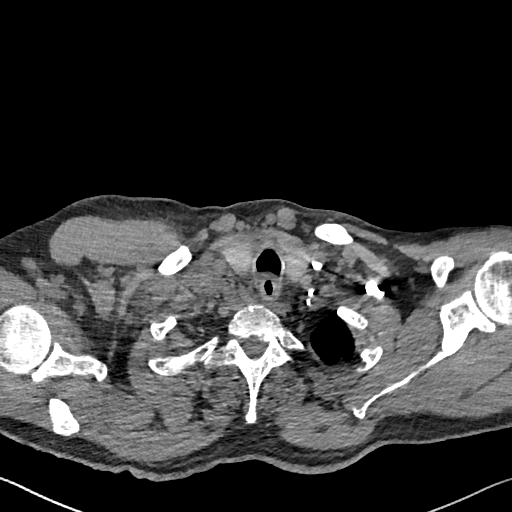
[im 254/269  lung]
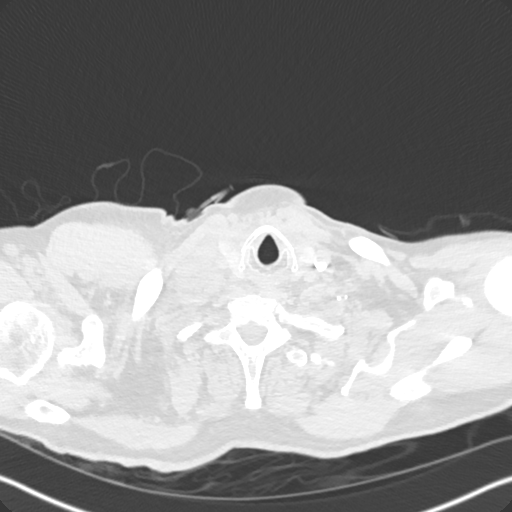

[Series 8: coronal mpr · coronal · 0.56mm/px · 3 of 139 slices shown]
[im 35/139  soft-tissue]
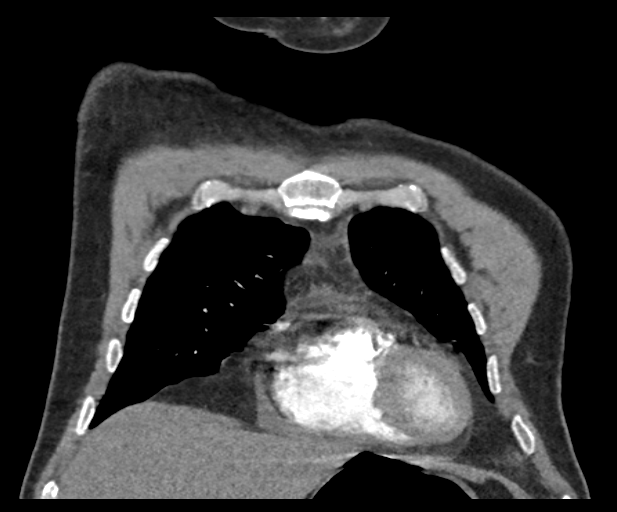
[im 70/139  soft-tissue]
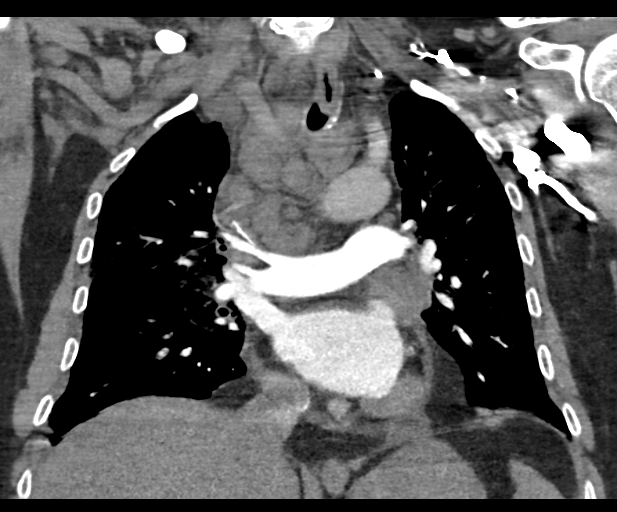
[im 104/139  soft-tissue]
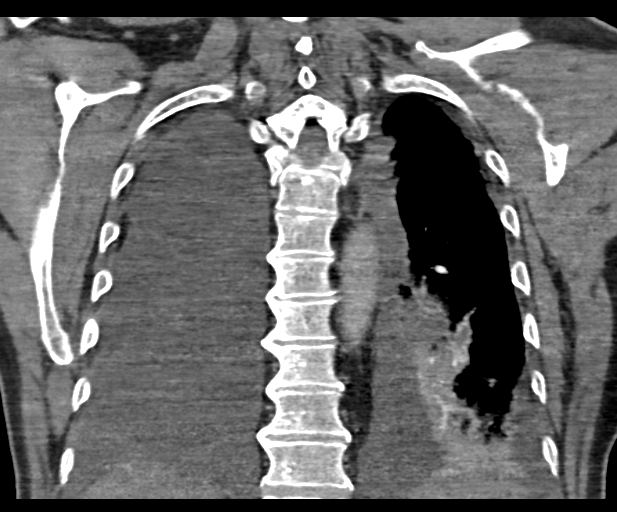

[18 of 46 positions shown; findings below may reference images not displayed]

FINDINGS: There is a moderate right and small left pleural effusion. No
pericardial effusion is identified. No pulmonary embolus is
identified. The descending left interlobar and ascending right
interlobar pulmonary arteries are narrowed by the patient's left
lower lobe mass and mediastinal adenopathy.

Bulky mediastinal lymphadenopathy is unchanged. Again seen is a
cm right paratracheal node on image 28, a 3.1 cm subcarinal node on
image 47 and a 2.7 cm left subcarinal lymph node on image 52. 5 cm
in diameter left lower lobe mass is again identified. Increased
compressive atelectasis is seen in the bases. There is a new nodular
opacity in the right upper lobe measuring 1.1 x 0.8 cm on image 27.
Incidentally imaged upper abdomen is unremarkable. No focal bony
abnormality is identified.

Review of the MIP images confirms the above findings.
IMPRESSION: Negative for pulmonary embolus. Both the descending left interlobar
and ascending right interlobar pulmonary arteries are compressed by
lymphadenopathy and the left lower lobe mass.

No change in bulky mediastinal lymphadenopathy and a left lower lobe
pulmonary mass.

Some increase in a moderate right and small left pleural effusion.

1.1 cm nodular opacity right upper lobe is new since CT scan 2 weeks
ago and indeterminate. Recommend attention on follow-up exams.

## 2014-11-22 IMAGING — CR DG CHEST 1V PORT
1 series · 1 of 1 positions shown · non-contrast
Comparison: Portable exam 2559 hr compared to 01/10/2014 ;
correlation CT chest 01/08/2014

CLINICAL DATA: Post thoracentesis

EXAM:
PORTABLE CHEST - 1 VIEW

[AP]
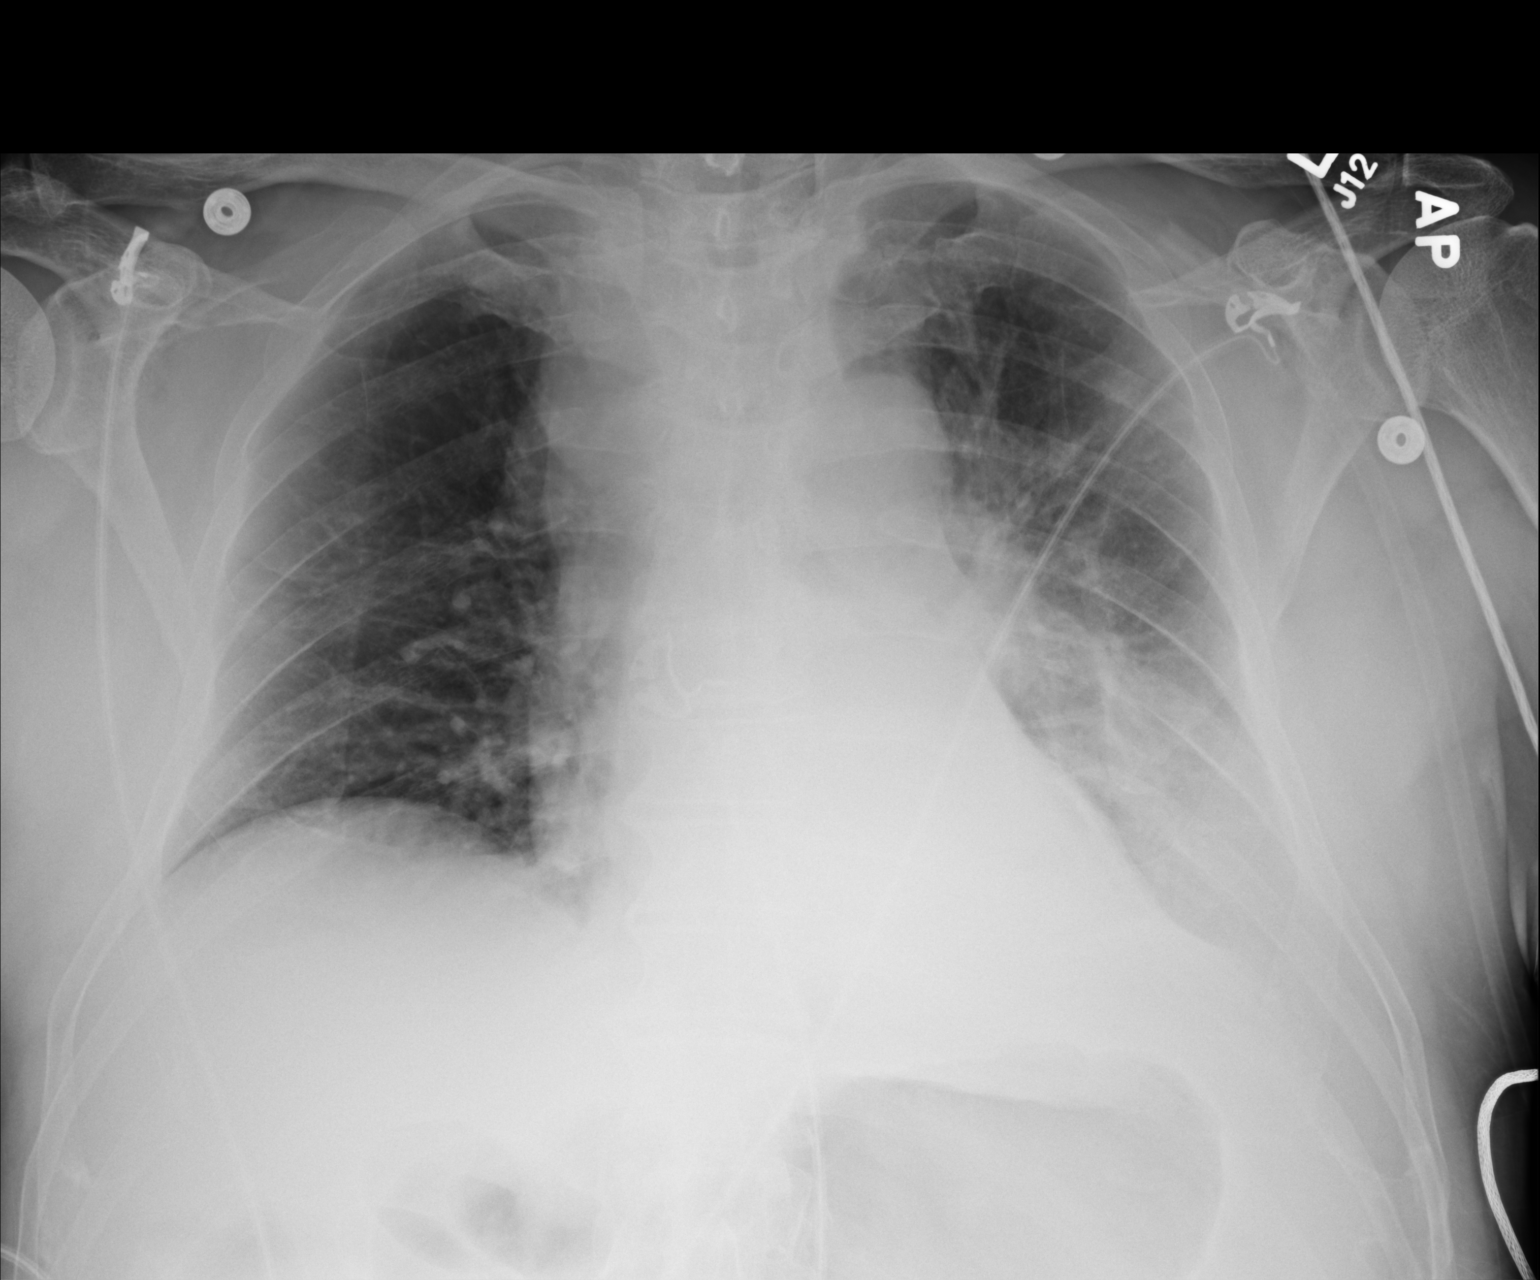

[1 of 1 positions shown; findings below may reference images not displayed]

FINDINGS: Normal heart size and pulmonary vascularity.

Prominent right peritracheal soft tissues and prominent left hilum
corresponding to adenopathy on prior CT.
Atelectasis versus consolidation left lower lobe.

Minimal right basilar atelectasis.

Persistent left pleural effusion.

No pneumothorax.
IMPRESSION: No pneumothorax following thoracentesis.

Persistent left pleural effusion and left lower lobe atelectasis
versus consolidation.

Thoracic adenopathy.
